# Patient Record
Sex: Male | Born: 1945
Health system: Southern US, Community
[De-identification: ages and names within clinical notes are randomized; demographics above are authoritative.]

## PROBLEM LIST (undated history)

## (undated) DIAGNOSIS — R7303 Prediabetes: Secondary | ICD-10-CM

## (undated) DIAGNOSIS — R112 Nausea with vomiting, unspecified: Secondary | ICD-10-CM

## (undated) DIAGNOSIS — K219 Gastro-esophageal reflux disease without esophagitis: Secondary | ICD-10-CM

## (undated) DIAGNOSIS — I10 Essential (primary) hypertension: Secondary | ICD-10-CM

## (undated) DIAGNOSIS — R0600 Dyspnea, unspecified: Secondary | ICD-10-CM

## (undated) DIAGNOSIS — Z87442 Personal history of urinary calculi: Secondary | ICD-10-CM

## (undated) DIAGNOSIS — Z8719 Personal history of other diseases of the digestive system: Secondary | ICD-10-CM

## (undated) DIAGNOSIS — Z9889 Other specified postprocedural states: Secondary | ICD-10-CM

## (undated) DIAGNOSIS — E119 Type 2 diabetes mellitus without complications: Secondary | ICD-10-CM

## (undated) DIAGNOSIS — B182 Chronic viral hepatitis C: Secondary | ICD-10-CM

## (undated) DIAGNOSIS — M1990 Unspecified osteoarthritis, unspecified site: Secondary | ICD-10-CM

## (undated) HISTORY — DX: Gastro-esophageal reflux disease without esophagitis: K21.9

## (undated) HISTORY — DX: Essential (primary) hypertension: I10

## (undated) HISTORY — DX: Chronic viral hepatitis C: B18.2

## (undated) HISTORY — DX: Prediabetes: R73.03

---

## 1978-03-26 HISTORY — PX: BACK SURGERY: SHX140

## 2003-04-06 ENCOUNTER — Ambulatory Visit (HOSPITAL_COMMUNITY): Admission: RE | Admit: 2003-04-06 | Discharge: 2003-04-06 | Payer: Self-pay | Admitting: Gastroenterology

## 2003-09-24 ENCOUNTER — Encounter: Admission: RE | Admit: 2003-09-24 | Discharge: 2003-09-24 | Payer: Self-pay | Admitting: Family Medicine

## 2005-01-19 ENCOUNTER — Inpatient Hospital Stay: Payer: Self-pay | Admitting: Internal Medicine

## 2005-01-19 ENCOUNTER — Other Ambulatory Visit: Payer: Self-pay

## 2012-02-11 ENCOUNTER — Other Ambulatory Visit: Payer: Self-pay | Admitting: Family Medicine

## 2012-02-11 DIAGNOSIS — R1900 Intra-abdominal and pelvic swelling, mass and lump, unspecified site: Secondary | ICD-10-CM

## 2012-02-13 ENCOUNTER — Ambulatory Visit
Admission: RE | Admit: 2012-02-13 | Discharge: 2012-02-13 | Disposition: A | Discharge status: Home / Self Care | Payer: Medicare Other | Source: Ambulatory Visit | Attending: Family Medicine | Admitting: Family Medicine

## 2012-02-13 DIAGNOSIS — R1900 Intra-abdominal and pelvic swelling, mass and lump, unspecified site: Secondary | ICD-10-CM

## 2012-08-19 ENCOUNTER — Ambulatory Visit (INDEPENDENT_AMBULATORY_CARE_PROVIDER_SITE_OTHER): Payer: Medicare Other | Admitting: Physician Assistant

## 2012-08-19 ENCOUNTER — Encounter: Payer: Self-pay | Admitting: Physician Assistant

## 2012-08-19 VITALS — BP 122/66 | HR 100 | Temp 99.1°F | Resp 18 | Ht 67.5 in | Wt 198.0 lb

## 2012-08-19 DIAGNOSIS — R5381 Other malaise: Secondary | ICD-10-CM

## 2012-08-19 DIAGNOSIS — W57XXXA Bitten or stung by nonvenomous insect and other nonvenomous arthropods, initial encounter: Secondary | ICD-10-CM

## 2012-08-19 DIAGNOSIS — T148 Other injury of unspecified body region: Secondary | ICD-10-CM

## 2012-08-19 DIAGNOSIS — R5383 Other fatigue: Secondary | ICD-10-CM

## 2012-08-19 DIAGNOSIS — IMO0001 Reserved for inherently not codable concepts without codable children: Secondary | ICD-10-CM

## 2012-08-19 MED ORDER — DOXYCYCLINE HYCLATE 100 MG PO TABS: 100.0000 mg | ORAL_TABLET | Freq: Two times a day (BID) | ORAL | Status: AC

## 2012-08-19 NOTE — Progress Notes (Signed)
Patient ID: GURTAJ RUZ MRN: [id number], DOB: 1946/01/14, 67 y.o. Date of Encounter: 08/19/2012, 9:21 AM    Chief Complaint:  Chief Complaint  Patient presents with  . aching all over  joints, head x 5 days  no energy    had removed ticks off him a while ago ??  has had fever  . seen at walk in clinic in [place] over weekend  started o     HPI: 67 y.o. year old male states that symptoms began 5 days ago. At that time, he felt achy all over and had headache. Has pressure in head and eyes. Not blowing anything out of nose. Has no cough. Has had no rash. Has had 3-4 tics that he has pulled off.  Went to [facility] over the weekend. They discussed possibility of Lyme/RMSF but at that time he had had no fever and no rash. So, they really felt it was a URI. Gave Rx for Azithromycin but told him to only fill it if symptoms persisted > 7 days b/c they really thought it was a viral URI.  However, the day after his visit at the [facility] (yesterday) he developed fever of 103.4.  He filled the Azithromycin and started it yesterday.   Still very achy all over , headache, and extreme fatigue. "Had to sit and rest this morning between shower and shaving!" Still no rash.  Home Meds: See attached medication section for any medications that were entered at today's visit. The computer does not put those onto this list.The following list is a list of meds entered prior to today's visit.   No current outpatient prescriptions on file prior to visit.   No current facility-administered medications on file prior to visit.    Allergies:  Allergies  Allergen Reactions  . Omnipaque (Iohexol) Nausea And Vomiting    Pt states he has severe vomiting with IV contrast, especially the older kind he had over 20 yrs ago.  He had it one other time, it was injected slower and he said he did better.  . Norvasc (Amlodipine Besylate) Swelling      Review of Systems: See HPI for pertinent ROS. All other ROS negative.     Physical Exam: Blood pressure 122/66, pulse 100, temperature 99.1 F (37.3 C), temperature source Oral, resp. rate 18, height 5' 7.5" (1.715 m), weight 198 lb (89.812 kg)., Body mass index is 30.54 kg/(m^2). General: WNWD WM. Appears in no acute distress. HEENT: Normocephalic, atraumatic, eyes without discharge, sclera non-icteric, nares are without discharge. Bilateral auditory canals clear, TM's are without perforation, pearly grey and translucent with reflective cone of light bilaterally. Oral cavity moist, posterior pharynx without exudate, erythema, peritonsillar abscess, or post nasal drip.No/Minimal sinus tenderness with percussion.   Neck: Supple. No thyromegaly. No lymphadenopathy. Lungs: Clear bilaterally to auscultation without wheezes, rales, or rhonchi. Breathing is unlabored. Heart: Regular rhythm. No murmurs, rubs, or gallops. Msk:  Strength and tone normal for age. Extremities/Skin: Warm and dry.  No rashes . Neuro: Alert and oriented X 3. Moves all extremities spontaneously. Gait is normal. CNII-XII grossly in tact. Psych:  Responds to questions appropriately with a normal affect.     ASSESSMENT AND PLAN:  67 y.o. year old male with  1. Tick bites - B. burgdorfi antibodies by WB - Rocky mtn spotted fvr abs pnl(IgG+IgM) - doxycycline (VIBRA-TABS) 100 MG tablet; Take 1 tablet (100 mg total) by mouth 2 (two) times daily.  Dispense: 42 tablet; Refill: 0  2. Myalgia  and myositis - B. burgdorfi antibodies by WB - Rocky mtn spotted fvr abs pnl(IgG+IgM) - doxycycline (VIBRA-TABS) 100 MG tablet; Take 1 tablet (100 mg total) by mouth 2 (two) times daily.  Dispense: 42 tablet; Refill: 0  3. Other malaise and fatigue - B. burgdorfi antibodies by WB - Rocky mtn spotted fvr abs pnl(IgG+IgM) - doxycycline (VIBRA-TABS) 100 MG tablet; Take 1 tablet (100 mg total) by mouth 2 (two) times daily.  Dispense: 42 tablet; Refill: 0  Discussed situation and Differential Diagnosis with  patient.  He has taken ZPack yesterday and today-has no GI upset with this. At this point, would complete this.  Will go ahead and start Doxy to cover Lyme/RMSF. Discussed that titers may be falsely negative. Even if lab results negative, he will complete the Doxycylcline.  However, he does opt to have lab done b/c if it is positive, he would like to know confirmed diagnosis.  F/U if worsens, does not resolve.   Signed, [address] [place], [place], [facility] 08/19/2012 9:21 AM

## 2012-08-20 LAB — ROCKY MTN SPOTTED FVR ABS PNL(IGG+IGM)
RMSF IgG: 0.72 IV
RMSF IgM: 0.15 IV

## 2012-08-20 LAB — B. BURGDORFI ANTIBODIES BY WB
B burgdorferi IgG Abs (IB): NEGATIVE
B burgdorferi IgM Abs (IB): NEGATIVE

## 2013-02-04 ENCOUNTER — Other Ambulatory Visit: Payer: Self-pay | Admitting: Family Medicine

## 2013-07-28 ENCOUNTER — Other Ambulatory Visit: Payer: Medicare Other

## 2013-07-28 ENCOUNTER — Other Ambulatory Visit: Payer: Self-pay | Admitting: Family Medicine

## 2013-07-28 DIAGNOSIS — Z79899 Other long term (current) drug therapy: Secondary | ICD-10-CM

## 2013-07-28 DIAGNOSIS — Z0000 Encounter for general adult medical examination without abnormal findings: Secondary | ICD-10-CM

## 2013-07-28 DIAGNOSIS — I10 Essential (primary) hypertension: Secondary | ICD-10-CM

## 2013-07-28 LAB — CBC WITH DIFFERENTIAL/PLATELET
Basophils Absolute: 0.1 10*3/uL (ref 0.0–0.1)
Basophils Relative: 1 % (ref 0–1)
Eosinophils Absolute: 0.3 10*3/uL (ref 0.0–0.7)
Eosinophils Relative: 4 % (ref 0–5)
HCT: 48.7 % (ref 39.0–52.0)
Hemoglobin: 16.8 g/dL (ref 13.0–17.0)
Lymphocytes Relative: 28 % (ref 12–46)
Lymphs Abs: 2 10*3/uL (ref 0.7–4.0)
MCH: 29.7 pg (ref 26.0–34.0)
MCHC: 34.5 g/dL (ref 30.0–36.0)
MCV: 86.2 fL (ref 78.0–100.0)
Monocytes Absolute: 0.6 10*3/uL (ref 0.1–1.0)
Monocytes Relative: 9 % (ref 3–12)
Neutro Abs: 4.1 10*3/uL (ref 1.7–7.7)
Neutrophils Relative %: 58 % (ref 43–77)
Platelets: 227 10*3/uL (ref 150–400)
RBC: 5.65 MIL/uL (ref 4.22–5.81)
RDW: 13.8 % (ref 11.5–15.5)
WBC: 7.1 10*3/uL (ref 4.0–10.5)

## 2013-07-28 LAB — LIPID PANEL
Cholesterol: 146 mg/dL (ref 0–200)
HDL: 61 mg/dL (ref >39)
LDL Cholesterol: 76 mg/dL (ref 0–99)
Total CHOL/HDL Ratio: 2.4 Ratio
Triglycerides: 43 mg/dL (ref <150)
VLDL: 9 mg/dL (ref 0–40)

## 2013-07-28 LAB — COMPLETE METABOLIC PANEL WITH GFR
ALT: 68 U/L — ABNORMAL HIGH (ref 0–53)
AST: 38 U/L — ABNORMAL HIGH (ref 0–37)
Albumin: 4.5 g/dL (ref 3.5–5.2)
Alkaline Phosphatase: 65 U/L (ref 39–117)
BUN: 24 mg/dL — ABNORMAL HIGH (ref 6–23)
CO2: 26 mEq/L (ref 19–32)
Calcium: 10.2 mg/dL (ref 8.4–10.5)
Chloride: 100 mEq/L (ref 96–112)
Creat: 1.14 mg/dL (ref 0.50–1.35)
GFR, Est African American: 76 mL/min
GFR, Est Non African American: 66 mL/min
Glucose, Bld: 119 mg/dL — ABNORMAL HIGH (ref 70–99)
Potassium: 5.5 mEq/L — ABNORMAL HIGH (ref 3.5–5.3)
Sodium: 137 mEq/L (ref 135–145)
Total Bilirubin: 1 mg/dL (ref 0.2–1.2)
Total Protein: 7.5 g/dL (ref 6.0–8.3)

## 2013-07-29 LAB — PSA, MEDICARE: PSA: 0.28 ng/mL (ref <=4.00)

## 2013-07-31 ENCOUNTER — Ambulatory Visit (INDEPENDENT_AMBULATORY_CARE_PROVIDER_SITE_OTHER): Payer: Medicare Other | Admitting: Family Medicine

## 2013-07-31 ENCOUNTER — Encounter: Payer: Self-pay | Admitting: Family Medicine

## 2013-07-31 VITALS — BP 126/70 | HR 72 | Temp 97.9°F | Resp 18 | Wt 201.0 lb

## 2013-07-31 DIAGNOSIS — R7303 Prediabetes: Secondary | ICD-10-CM | POA: Insufficient documentation

## 2013-07-31 DIAGNOSIS — I10 Essential (primary) hypertension: Secondary | ICD-10-CM

## 2013-07-31 DIAGNOSIS — Z79899 Other long term (current) drug therapy: Secondary | ICD-10-CM

## 2013-07-31 LAB — HEPATITIS PANEL, ACUTE
HCV Ab: REACTIVE — AB
Hep A IgM: NONREACTIVE
Hep B C IgM: NONREACTIVE
Hepatitis B Surface Ag: NEGATIVE

## 2013-07-31 LAB — HEMOGLOBIN A1C
Hgb A1c MFr Bld: 6.3 % — ABNORMAL HIGH (ref <5.7)
Mean Plasma Glucose: 134 mg/dL — ABNORMAL HIGH (ref <117)

## 2013-07-31 NOTE — Progress Notes (Signed)
Subjective:    Patient ID: James Harper, male    DOB: 15-Feb-1946, 68 y.o.   MRN: [id number]  HPI Patient is here today to followup for his hypertension. He is currently on Hyzaar 50/12.5 one by mouth daily. His blood pressures well controlled at 126/70. His most recent lab work is listed below: Lab on 07/28/2013  Component Date Value Ref Range Status  . Cholesterol 07/28/2013 146  0 - 200 mg/dL Final   Comment: ATP III Classification:                                < 200        mg/dL        Desirable                               200 - 239     mg/dL        Borderline High                               >= 240        mg/dL        High                             . Triglycerides 07/28/2013 43  <150 mg/dL Final  . HDL 07/28/2013 61  >39 mg/dL Final  . Total CHOL/HDL Ratio 07/28/2013 2.4   Final  . VLDL 07/28/2013 9  0 - 40 mg/dL Final  . LDL Cholesterol 07/28/2013 76  0 - 99 mg/dL Final   Comment:                            Total Cholesterol/HDL Ratio:CHD Risk                                                 Coronary Heart Disease Risk Table                                                                 Men       Women                                   1/2 Average Risk              3.4        3.3                                       Average Risk              5.0        4.4  2X Average Risk              9.6        7.1                                    3X Average Risk             23.4       11.0                          Use the calculated Patient Ratio above and the CHD Risk table                           to determine the patient's CHD Risk.                          ATP III Classification (LDL):                                < 100        mg/dL         Optimal                               100 - 129     mg/dL         Near or Above Optimal                               130 - 159     mg/dL         Borderline High                               160 - 189      mg/dL         High                                > 190        mg/dL         Very High                             . WBC 07/28/2013 7.1  4.0 - 10.5 K/uL Final  . RBC 07/28/2013 5.65  4.22 - 5.81 MIL/uL Final  . Hemoglobin 07/28/2013 16.8  13.0 - 17.0 g/dL Final  . HCT 07/28/2013 48.7  39.0 - 52.0 % Final  . MCV 07/28/2013 86.2  78.0 - 100.0 fL Final  . MCH 07/28/2013 29.7  26.0 - 34.0 pg Final  . MCHC 07/28/2013 34.5  30.0 - 36.0 g/dL Final  . RDW 07/28/2013 13.8  11.5 - 15.5 % Final  . Platelets 07/28/2013 227  150 - 400 K/uL Final  . Neutrophils Relative % 07/28/2013 58  43 - 77 % Final  . Neutro Abs 07/28/2013 4.1  1.7 - 7.7 K/uL Final  . Lymphocytes Relative 07/28/2013 28  12 - 46 % Final  . Lymphs Abs 07/28/2013 2.0  0.7 - 4.0  K/uL Final  . Monocytes Relative 07/28/2013 9  3 - 12 % Final  . Monocytes Absolute 07/28/2013 0.6  0.1 - 1.0 K/uL Final  . Eosinophils Relative 07/28/2013 4  0 - 5 % Final  . Eosinophils Absolute 07/28/2013 0.3  0.0 - 0.7 K/uL Final  . Basophils Relative 07/28/2013 1  0 - 1 % Final  . Basophils Absolute 07/28/2013 0.1  0.0 - 0.1 K/uL Final  . Smear Review 07/28/2013 Criteria for review not met   Final  . Sodium 07/28/2013 137  135 - 145 mEq/L Final  . Potassium 07/28/2013 5.5* 3.5 - 5.3 mEq/L Final   No visible hemolysis.  . Chloride 07/28/2013 100  96 - 112 mEq/L Final  . CO2 07/28/2013 26  19 - 32 mEq/L Final  . Glucose, Bld 07/28/2013 119* 70 - 99 mg/dL Final  . BUN 07/28/2013 24* 6 - 23 mg/dL Final  . Creat 07/28/2013 1.14  0.50 - 1.35 mg/dL Final  . Total Bilirubin 07/28/2013 1.0  0.2 - 1.2 mg/dL Final  . Alkaline Phosphatase 07/28/2013 65  39 - 117 U/L Final  . AST 07/28/2013 38* 0 - 37 U/L Final  . ALT 07/28/2013 68* 0 - 53 U/L Final  . Total Protein 07/28/2013 7.5  6.0 - 8.3 g/dL Final  . Albumin 07/28/2013 4.5  3.5 - 5.2 g/dL Final  . Calcium 07/28/2013 10.2  8.4 - 10.5 mg/dL Final  . GFR, Est African American 07/28/2013 76   Final  .  GFR, Est Non African American 07/28/2013 66   Final   Comment:                            The estimated GFR is a calculation valid for adults (>=43 years old)                          that uses the CKD-EPI algorithm to adjust for age and sex. It is                            not to be used for children, pregnant women, hospitalized patients,                             patients on dialysis, or with rapidly changing kidney function.                          According to the NKDEP, eGFR >89 is normal, 60-89 shows mild                          impairment, 30-59 shows moderate impairment, 15-29 shows severe                          impairment and <15 is ESRD.                             Marland Kitchen PSA 07/28/2013 0.28  <=4.00 ng/mL Final   Comment: Test Methodology: ECLIA PSA (Electrochemiluminescence Immunoassay)  For PSA values from 2.5-4.0, particularly in younger men <60 years                          old, the AUA and NCCN suggest testing for % Free PSA (3515) and                          evaluation of the rate of increase in PSA (PSA velocity).   Patient refuses colonoscopy. He refuses Fosamax. He refuses pneumonia vaccine. His most recent lab work is significant for mild liver irritation along with an elevated blood sugar of 119. The remainder of his review of systems is negative. Past Medical History  Diagnosis Date  . Hypertension   . GERD (gastroesophageal reflux disease)   . Prediabetes    No past surgical history on file. Current Outpatient Prescriptions on File Prior to Visit  Medication Sig Dispense Refill  . losartan-hydrochlorothiazide (HYZAAR) 50-12.5 MG per tablet TAKE 1 TABLET EVERY DAY  30 tablet  11  . Pantoprazole Sodium (PROTONIX PO) Take 40 mg by mouth daily as needed.        No current facility-administered medications on file prior to visit.   Allergies  Allergen Reactions  . Omnipaque [Iohexol] Nausea And Vomiting    Pt  states he has severe vomiting with IV contrast, especially the older kind he had over 20 yrs ago.  He had it one other time, it was injected slower and he said he did better.  Marland Kitchen Norvasc [Amlodipine Besylate] Swelling   History   Social History  . Marital Status: Married    Spouse Name: N/A    Number of Children: N/A  . Years of Education: N/A   Occupational History  . Not on file.   Social History Main Topics  . Smoking status: Former Smoker    Quit date: 08/19/1972  . Smokeless tobacco: Never Used  . Alcohol Use: No  . Drug Use: No  . Sexual Activity: Not on file   Other Topics Concern  . Not on file   Social History Narrative  . No narrative on file      Review of Systems  All other systems reviewed and are negative.      Objective:   Physical Exam  Vitals reviewed. Neck: Neck supple. No JVD present. No thyromegaly present.  Cardiovascular: Normal rate, regular rhythm and normal heart sounds.   No murmur heard. Pulmonary/Chest: Effort normal and breath sounds normal. No respiratory distress. He has no wheezes. He has no rales. He exhibits no tenderness.  Abdominal: Soft. Bowel sounds are normal. He exhibits no distension and no mass. There is no tenderness. There is no rebound and no guarding.  Musculoskeletal: He exhibits no edema.  Lymphadenopathy:    He has no cervical adenopathy.          Assessment & Plan:  1. HTN (hypertension)  2. Encounter for long-term (current) use of other medications Blood pressure is well controlled. I will add a hemoglobin A1c to evaluate for prediabetes. We discussed therapeutic lifestyle changes including diet and exercise. If the patient's hemoglobin A1c is greater than 6.5 I recommend starting metformin. His cholesterol is excellent. His PSA is within normal limits. I recommended a colonoscopy, pneumonia vaccine, and shingles vaccine all of  which the patient refused.  I also check a viral hepatitis panel given his mildly  elevated liver function tests. I recommended abstinence from  alcohol.

## 2013-08-04 ENCOUNTER — Other Ambulatory Visit: Payer: Self-pay | Admitting: Family Medicine

## 2013-08-04 ENCOUNTER — Other Ambulatory Visit: Payer: Medicare Other

## 2013-08-04 DIAGNOSIS — R768 Other specified abnormal immunological findings in serum: Secondary | ICD-10-CM

## 2013-08-04 DIAGNOSIS — K219 Gastro-esophageal reflux disease without esophagitis: Secondary | ICD-10-CM

## 2013-08-04 MED ORDER — PANTOPRAZOLE SODIUM 40 MG PO TBEC: 40.0000 mg | DELAYED_RELEASE_TABLET | Freq: Every day | ORAL | Status: DC

## 2013-08-04 NOTE — Telephone Encounter (Signed)
Message copied by Olena Mater on Tue Aug 04, 2013  8:49 AM ------      Message from: Jenna Luo      Created: Mon Aug 03, 2013  7:25 AM       Prediabetes is stable.  Unfortunately, labs suggest he may have hepatitis C.  Please come in for Hep C RNA Quant (lab [id number]) to confirm.  Then we will discuss the next step. ------

## 2013-08-04 NOTE — Telephone Encounter (Signed)
Pt aware of lab results and need for further blood work.  Told can come to office any time.  Lab ordered.  Asked for refill of protonix, Rx sent.

## 2013-08-05 LAB — HEPATITIS C RNA QUANTITATIVE
HCV Quantitative Log: 6.61 {Log} — ABNORMAL HIGH (ref <1.18)
HCV Quantitative: 4120183 IU/mL — ABNORMAL HIGH (ref <15)

## 2013-08-11 ENCOUNTER — Encounter: Payer: Self-pay | Admitting: Family Medicine

## 2013-08-11 ENCOUNTER — Ambulatory Visit (INDEPENDENT_AMBULATORY_CARE_PROVIDER_SITE_OTHER): Payer: Medicare Other | Admitting: Family Medicine

## 2013-08-11 VITALS — BP 140/80 | HR 80 | Temp 98.5°F | Resp 16 | Ht 69.0 in | Wt 200.0 lb

## 2013-08-11 DIAGNOSIS — B182 Chronic viral hepatitis C: Secondary | ICD-10-CM

## 2013-08-11 LAB — HEPATITIS C GENOTYPE

## 2013-08-11 NOTE — Progress Notes (Signed)
 Subjective:    Patient ID: James Harper, male    DOB: 03/16/1946, 68 y.o.   MRN: [id number]  HPI  When the patient was here for his last medicine check, I discovered that he had mild elevations in his liver function test. I therefore performed a viral hepatitis panel which returned positive for antibodies to hepatitis C. I confirmed the presence of active bowel hepatitis C by checking a viral load. Patient's genotype returned positive for type IA. Past Medical History  Diagnosis Date  . Hypertension   . GERD (gastroesophageal reflux disease)   . Prediabetes   . Hep C w/o coma, chronic    No past surgical history on file. Current Outpatient Prescriptions on File Prior to Visit  Medication Sig Dispense Refill  . losartan-hydrochlorothiazide (HYZAAR) 50-12.5 MG per tablet TAKE 1 TABLET EVERY DAY  30 tablet  11  . pantoprazole (PROTONIX) 40 MG tablet Take 1 tablet (40 mg total) by mouth daily.  30 tablet  3   No current facility-administered medications on file prior to visit.   Allergies  Allergen Reactions  . Omnipaque [Iohexol] Nausea And Vomiting    Pt states he has severe vomiting with IV contrast, especially the older kind he had over 20 yrs ago.  He had it one other time, it was injected slower and he said he did better.  . Norvasc [Amlodipine Besylate] Swelling   History   Social History  . Marital Status: Married    Spouse Name: N/A    Number of Children: N/A  . Years of Education: N/A   Occupational History  . Not on file.   Social History Main Topics  . Smoking status: Former Smoker    Quit date: 08/19/1972  . Smokeless tobacco: Never Used  . Alcohol Use: No  . Drug Use: No  . Sexual Activity: Not on file   Other Topics Concern  . Not on file   Social History Narrative  . No narrative on file     Review of Systems  All other systems reviewed and are negative.      Objective:   Physical Exam  Vitals reviewed. Cardiovascular: Normal rate, regular  rhythm and normal heart sounds.   Pulmonary/Chest: Effort normal and breath sounds normal.  Abdominal: Soft. Bowel sounds are normal.          Assessment & Plan:  1. Hep C w/o coma, chronic I referred the patient to hepatitis C clinic.  His most recent lab work is listed below: Appointment on 08/04/2013  Component Date Value Ref Range Status  . HCV Quantitative 08/04/2013 [id number]* <15 IU/mL Final  . HCV Quantitative Log 08/04/2013 6.61* <1.18 log 10 Final   Comment:                            This test utilizes the [place] FDA approved Roche HCV Test Kit by RT-PCR.  Orders Only on 08/04/2013  Component Date Value Ref Range Status  . HCV Genotype 08/04/2013 1a   Final   Test Methodology: Abbott RealTime HCV Genotype II Assay.  Lab on 07/28/2013  Component Date Value Ref Range Status  . Cholesterol 07/28/2013 146  0 - 200 mg/dL Final   Comment: ATP III Classification:                                < 200          mg/dL        Desirable                               200 - 239     mg/dL        Borderline High                               >= 240        mg/dL        High                             . Triglycerides 07/28/2013 43  <150 mg/dL Final  . HDL 07/28/2013 61  >39 mg/dL Final  . Total CHOL/HDL Ratio 07/28/2013 2.4   Final  . VLDL 07/28/2013 9  0 - 40 mg/dL Final  . LDL Cholesterol 07/28/2013 76  0 - 99 mg/dL Final   Comment:                            Total Cholesterol/HDL Ratio:CHD Risk                                                 Coronary Heart Disease Risk Table                                                                 Men       Women                                   1/2 Average Risk              3.4        3.3                                       Average Risk              5.0        4.4                                    2X Average Risk              9.6        7.1                                    3X Average Risk             23.4       11.0  Use the calculated Patient Ratio above and the CHD Risk table                           to determine the patient's CHD Risk.                          ATP III Classification (LDL):                                < 100        mg/dL         Optimal                               100 - 129     mg/dL         Near or Above Optimal                               130 - 159     mg/dL         Borderline High                               160 - 189     mg/dL         High                                > 190        mg/dL         Very High                             . WBC 07/28/2013 7.1  4.0 - 10.5 K/uL Final  . RBC 07/28/2013 5.65  4.22 - 5.81 MIL/uL Final  . Hemoglobin 07/28/2013 16.8  13.0 - 17.0 g/dL Final  . HCT 07/28/2013 48.7  39.0 - 52.0 % Final  . MCV 07/28/2013 86.2  78.0 - 100.0 fL Final  . MCH 07/28/2013 29.7  26.0 - 34.0 pg Final  . MCHC 07/28/2013 34.5  30.0 - 36.0 g/dL Final  . RDW 07/28/2013 13.8  11.5 - 15.5 % Final  . Platelets 07/28/2013 227  150 - 400 K/uL Final  . Neutrophils Relative % 07/28/2013 58  43 - 77 % Final  . Neutro Abs 07/28/2013 4.1  1.7 - 7.7 K/uL Final  . Lymphocytes Relative 07/28/2013 28  12 - 46 % Final  . Lymphs Abs 07/28/2013 2.0  0.7 - 4.0 K/uL Final  . Monocytes Relative 07/28/2013 9  3 - 12 % Final  . Monocytes Absolute 07/28/2013 0.6  0.1 - 1.0 K/uL Final  . Eosinophils Relative 07/28/2013 4  0 - 5 % Final  . Eosinophils Absolute 07/28/2013 0.3  0.0 - 0.7 K/uL Final  . Basophils Relative 07/28/2013 1  0 - 1 % Final  . Basophils Absolute 07/28/2013 0.1  0.0 - 0.1 K/uL Final  . Smear Review 07/28/2013 Criteria for review not met   Final  . Sodium 07/28/2013 137  135 - 145 mEq/L Final  . Potassium 07/28/2013 5.5* 3.5 - 5.3 mEq/L Final     No visible hemolysis.  . Chloride 07/28/2013 100  96 - 112 mEq/L Final  . CO2 07/28/2013 26  19 - 32 mEq/L Final  . Glucose, Bld 07/28/2013 119* 70 - 99 mg/dL Final  . BUN 07/28/2013 24* 6 - 23 mg/dL Final  . Creat  07/28/2013 1.14  0.50 - 1.35 mg/dL Final  . Total Bilirubin 07/28/2013 1.0  0.2 - 1.2 mg/dL Final  . Alkaline Phosphatase 07/28/2013 65  39 - 117 U/L Final  . AST 07/28/2013 38* 0 - 37 U/L Final  . ALT 07/28/2013 68* 0 - 53 U/L Final  . Total Protein 07/28/2013 7.5  6.0 - 8.3 g/dL Final  . Albumin 07/28/2013 4.5  3.5 - 5.2 g/dL Final  . Calcium 07/28/2013 10.2  8.4 - 10.5 mg/dL Final  . GFR, Est African American 07/28/2013 76   Final  . GFR, Est Non African American 07/28/2013 66   Final   Comment:                            The estimated GFR is a calculation valid for adults (>=18 years old)                          that uses the CKD-EPI algorithm to adjust for age and sex. It is                            not to be used for children, pregnant women, hospitalized patients,                             patients on dialysis, or with rapidly changing kidney function.                          According to the NKDEP, eGFR >89 is normal, 60-89 shows mild                          impairment, 30-59 shows moderate impairment, 15-29 shows severe                          impairment and <15 is ESRD.                             . PSA 07/28/2013 0.28  <=4.00 ng/mL Final   Comment: Test Methodology: ECLIA PSA (Electrochemiluminescence Immunoassay)                                                     For PSA values from 2.5-4.0, particularly in younger men <60 years                          old, the AUA and NCCN suggest testing for % Free PSA (3515) and                          evaluation of the rate of increase in PSA (PSA velocity).  Orders Only on 07/28/2013  Component   Date Value Ref Range Status  . Hepatitis B Surface Ag 07/28/2013 NEGATIVE  NEGATIVE Final  . HCV Ab 07/28/2013 REACTIVE* NEGATIVE Corrected   Comment:                                                                                                 This test is for screening purposes only.  Reactive results should be                           confirmed by an alternative method.  Suggest HCV Qualitative, PCR,                          test code 83130.  Specimens will be stable for reflex testing up to 3                          days after collection.                          Result repeated and verified.                                                     Amended report.  . Hep B C IgM 07/28/2013 NON REACTIVE  NON REACTIVE Final   Comment: High levels of Hepatitis B Core IgM antibody are detectable                          during the acute stage of Hepatitis B. This antibody is used                          to differentiate current from past HBV infection.                             . Hep A IgM 07/28/2013 NON REACTIVE  NON REACTIVE Final  . Hemoglobin A1C 07/28/2013 6.3* <5.7 % Final   Comment:                                                                                                 According to the ADA Clinical Practice Recommendations for 2011, when                          HbA1c is used  as a screening test:                                                       >=6.5%   Diagnostic of Diabetes Mellitus                                     (if abnormal result is confirmed)                                                     5.7-6.4%   Increased risk of developing Diabetes Mellitus                                                     References:Diagnosis and Classification of Diabetes Mellitus,Diabetes                          ZOXW,9604,54(UJWJX 1):S62-S69 and Standards of Medical Care in                                  Diabetes - 2011,Diabetes BJYN,8295,62 (Suppl 1):S11-S61.                             . Mean Plasma Glucose 07/28/2013 134* <117 mg/dL Final   I recommended avoidance of alcohol. Recheck fasting lipid panel and Hg A1c in 3-6 months. - Ambulatory referral to Gastroenterology

## 2013-08-19 ENCOUNTER — Telehealth: Payer: Self-pay | Admitting: *Deleted

## 2013-08-19 NOTE — Telephone Encounter (Signed)
wife called checkin on status of referral I told her I had lm on vm for [facility] to contact me and have not heard anything yet. I call Dawn from [facility] and she stated that they have recieved referal but no appt has been mad yet d/t trying to contact pt and no return call. they will try pt again today to schedule appt. I also contacted [facility] and lm with them also to see if can schedule pt.

## 2013-09-17 ENCOUNTER — Other Ambulatory Visit: Payer: Self-pay | Admitting: Nurse Practitioner

## 2013-09-17 DIAGNOSIS — C220 Liver cell carcinoma: Secondary | ICD-10-CM

## 2013-09-23 ENCOUNTER — Ambulatory Visit
Admission: RE | Admit: 2013-09-23 | Discharge: 2013-09-23 | Disposition: A | Discharge status: Home / Self Care | Payer: Medicare Other | Source: Ambulatory Visit | Attending: Nurse Practitioner | Admitting: Nurse Practitioner

## 2013-09-23 DIAGNOSIS — C220 Liver cell carcinoma: Secondary | ICD-10-CM

## 2014-01-08 ENCOUNTER — Other Ambulatory Visit: Payer: Self-pay

## 2014-02-13 ENCOUNTER — Other Ambulatory Visit: Payer: Self-pay | Admitting: Family Medicine

## 2014-02-27 ENCOUNTER — Other Ambulatory Visit: Payer: Self-pay | Admitting: Family Medicine

## 2014-09-20 ENCOUNTER — Other Ambulatory Visit: Payer: Self-pay

## 2015-02-01 ENCOUNTER — Ambulatory Visit (INDEPENDENT_AMBULATORY_CARE_PROVIDER_SITE_OTHER): Payer: Medicare Other | Admitting: Family Medicine

## 2015-02-01 ENCOUNTER — Encounter: Payer: Self-pay | Admitting: Family Medicine

## 2015-02-01 VITALS — BP 150/90 | HR 68 | Temp 98.5°F | Resp 18 | Wt 208.0 lb

## 2015-02-01 DIAGNOSIS — I10 Essential (primary) hypertension: Secondary | ICD-10-CM | POA: Diagnosis not present

## 2015-02-01 DIAGNOSIS — R739 Hyperglycemia, unspecified: Secondary | ICD-10-CM

## 2015-02-01 DIAGNOSIS — J019 Acute sinusitis, unspecified: Secondary | ICD-10-CM

## 2015-02-01 MED ORDER — LOSARTAN POTASSIUM-HCTZ 50-12.5 MG PO TABS: 2.0000 | ORAL_TABLET | Freq: Every day | ORAL | Status: DC

## 2015-02-01 MED ORDER — AMOXICILLIN-POT CLAVULANATE 875-125 MG PO TABS: 1.0000 | ORAL_TABLET | Freq: Two times a day (BID) | ORAL | Status: DC

## 2015-02-01 NOTE — Progress Notes (Signed)
   Subjective:    Patient ID: James Harper, male    DOB: 1945/11/08, 69 y.o.   MRN: [id number]  HPI Patient has 3-4 weeks of pain in both maxillary sinuses, postnasal drip. Both ears feel completely stopped up. He has ringing in his right ear. He reports dull headaches. He has had subjective fevers. He has tried nasal saline and tincture of time however his sinuses continue to worsen. He has difficult time breathing through his nose. His blood pressures elevated. He also reports that his blood sugars at home have been ranging in the 140s. Past Medical History  Diagnosis Date  . Hypertension   . GERD (gastroesophageal reflux disease)   . Prediabetes   . Hep C w/o coma, chronic (HCC)    No past surgical history on file. Current Outpatient Prescriptions on File Prior to Visit  Medication Sig Dispense Refill  . pantoprazole (PROTONIX) 40 MG tablet TAKE 1 TABLET BY MOUTH EVERY DAY 30 tablet 11   No current facility-administered medications on file prior to visit.   Allergies  Allergen Reactions  . Omnipaque [Iohexol] Nausea And Vomiting    Pt states he has severe vomiting with IV contrast, especially the older kind he had over 20 yrs ago.  He had it one other time, it was injected slower and he said he did better.  . Norvasc [Amlodipine Besylate] Swelling   Social History   Social History  . Marital Status: Married    Spouse Name: N/A  . Number of Children: N/A  . Years of Education: N/A   Occupational History  . Not on file.   Social History Main Topics  . Smoking status: Former Smoker    Quit date: 08/19/1972  . Smokeless tobacco: Never Used  . Alcohol Use: No  . Drug Use: No  . Sexual Activity: Not on file   Other Topics Concern  . Not on file   Social History Narrative      Review of Systems  All other systems reviewed and are negative.      Objective:   Physical Exam  Constitutional: He appears well-developed and well-nourished.  HENT:  Right Ear:  Tympanic membrane, external ear and ear canal normal.  Left Ear: Tympanic membrane, external ear and ear canal normal.  Nose: Mucosal edema and rhinorrhea present. Right sinus exhibits maxillary sinus tenderness. Left sinus exhibits maxillary sinus tenderness.  Mouth/Throat: Oropharynx is clear and moist. No oropharyngeal exudate.  Cardiovascular: Normal rate, regular rhythm and normal heart sounds.   Pulmonary/Chest: Effort normal and breath sounds normal.  Vitals reviewed.         Assessment & Plan:  Acute rhinosinusitis - Plan: amoxicillin-clavulanate (AUGMENTIN) 875-125 MG tablet  Essential hypertension - Plan: COMPLETE METABOLIC PANEL WITH GFR, Lipid panel  Hyperglycemia - Plan: COMPLETE METABOLIC PANEL WITH GFR, Hemoglobin A1c, Lipid panel  Patient has a sinus infection. Begin Augmentin 875 mg by mouth twice a day for 10 days. Increase Hyzaar to 100/25 by mouth daily for hypertension. Return fasting for a CMP, fasting lipid panel, and hemoglobin A1c to monitor his blood sugar.

## 2015-02-02 ENCOUNTER — Other Ambulatory Visit: Payer: Medicare Other

## 2015-02-02 LAB — COMPLETE METABOLIC PANEL WITH GFR
ALT: 15 U/L (ref 9–46)
AST: 17 U/L (ref 10–35)
Albumin: 4.4 g/dL (ref 3.6–5.1)
Alkaline Phosphatase: 65 U/L (ref 40–115)
BUN: 17 mg/dL (ref 7–25)
CO2: 29 mmol/L (ref 20–31)
Calcium: 9.7 mg/dL (ref 8.6–10.3)
Chloride: 103 mmol/L (ref 98–110)
Creat: 0.94 mg/dL (ref 0.70–1.25)
GFR, Est African American: >89 mL/min (ref >=60)
GFR, Est Non African American: 82 mL/min (ref >=60)
Glucose, Bld: 112 mg/dL — ABNORMAL HIGH (ref 70–99)
Potassium: 5.2 mmol/L (ref 3.5–5.3)
Sodium: 139 mmol/L (ref 135–146)
Total Bilirubin: 1 mg/dL (ref 0.2–1.2)
Total Protein: 7.4 g/dL (ref 6.1–8.1)

## 2015-02-02 LAB — LIPID PANEL
Cholesterol: 141 mg/dL (ref 125–200)
HDL: 52 mg/dL (ref >=40)
LDL Cholesterol: 79 mg/dL (ref <130)
Total CHOL/HDL Ratio: 2.7 Ratio (ref <=5.0)
Triglycerides: 50 mg/dL (ref <150)
VLDL: 10 mg/dL (ref <30)

## 2015-02-02 LAB — HEMOGLOBIN A1C
Hgb A1c MFr Bld: 6.3 % — ABNORMAL HIGH (ref <5.7)
Mean Plasma Glucose: 134 mg/dL — ABNORMAL HIGH (ref <117)

## 2015-02-03 ENCOUNTER — Encounter: Payer: Self-pay | Admitting: *Deleted

## 2015-02-09 ENCOUNTER — Other Ambulatory Visit: Payer: Self-pay | Admitting: Family Medicine

## 2015-02-09 NOTE — Telephone Encounter (Signed)
Medication refilled per protocol. 

## 2015-12-27 ENCOUNTER — Encounter: Payer: Self-pay | Admitting: Family Medicine

## 2015-12-27 ENCOUNTER — Ambulatory Visit (INDEPENDENT_AMBULATORY_CARE_PROVIDER_SITE_OTHER): Payer: Medicare Other | Admitting: Family Medicine

## 2015-12-27 VITALS — BP 140/78 | HR 80 | Temp 98.0°F | Resp 14 | Ht 69.0 in | Wt 210.0 lb

## 2015-12-27 DIAGNOSIS — R739 Hyperglycemia, unspecified: Secondary | ICD-10-CM

## 2015-12-27 DIAGNOSIS — J019 Acute sinusitis, unspecified: Secondary | ICD-10-CM

## 2015-12-27 DIAGNOSIS — I10 Essential (primary) hypertension: Secondary | ICD-10-CM | POA: Diagnosis not present

## 2015-12-27 DIAGNOSIS — Z125 Encounter for screening for malignant neoplasm of prostate: Secondary | ICD-10-CM | POA: Diagnosis not present

## 2015-12-27 LAB — CBC WITH DIFFERENTIAL/PLATELET
Basophils Absolute: 0 cells/uL (ref 0–200)
Basophils Relative: 0 %
Eosinophils Absolute: 284 cells/uL (ref 15–500)
Eosinophils Relative: 4 %
HCT: 45.2 % (ref 38.5–50.0)
Hemoglobin: 15.3 g/dL (ref 13.0–17.0)
Lymphocytes Relative: 28 %
Lymphs Abs: 1988 cells/uL (ref 850–3900)
MCH: 29.6 pg (ref 27.0–33.0)
MCHC: 33.8 g/dL (ref 32.0–36.0)
MCV: 87.4 fL (ref 80.0–100.0)
MPV: 10.9 fL (ref 7.5–12.5)
Monocytes Absolute: 497 cells/uL (ref 200–950)
Monocytes Relative: 7 %
Neutro Abs: 4331 cells/uL (ref 1500–7800)
Neutrophils Relative %: 61 %
Platelets: 229 10*3/uL (ref 140–400)
RBC: 5.17 MIL/uL (ref 4.20–5.80)
RDW: 13.5 % (ref 11.0–15.0)
WBC: 7.1 10*3/uL (ref 3.8–10.8)

## 2015-12-27 LAB — PSA: PSA: 0.2 ng/mL (ref <=4.0)

## 2015-12-27 MED ORDER — CEFDINIR 300 MG PO CAPS: 300.0000 mg | ORAL_CAPSULE | Freq: Two times a day (BID) | ORAL | 0 refills | Status: DC

## 2015-12-27 NOTE — Progress Notes (Signed)
Subjective:    Patient ID: James Harper, male    DOB: [birth year], 70 y.o.   MRN: [id number]  HPI 07/2013 When the patient was here for his last medicine check, I discovered that he had mild elevations in his liver function test. I therefore performed a viral hepatitis panel which returned positive for antibodies to hepatitis C. I confirmed the presence of active bowel hepatitis C by checking a viral load. Patient's genotype returned positive for type IA.  At that time, my plan was: 1. Hep C w/o coma, chronic I referred the patient to hepatitis C clinic. I recommended avoidance of alcohol. Recheck fasting lipid panel and Hg A1c in 3-6 months. - Ambulatory referral to Gastroenterology  12/27/2015 Has not been seen for follow up of his chronic medical conditions since.  The patient has been checking his blood sugar at home.  Fasting sugars can range from 130-150.  2 hour post prandial sugars are <120.  Denies CP/SOB/DOE.  Denies polyyuria, polydypsia, or blurry vision.  Refuses flu shot and pneumovax.  Would like PSA.  For the last 4 or 5 weeks, the patient has had right-sided headache. It is primarily centered in his right frontal sinus and right maxillary sinus. He describes it as a constant pressure. He also reports pressure and pain in his right ear and in his right jaw. Occasionally the pain will radiate into his right occiput. He also reports possibly some mild blurry vision in his right eye. He denies any cough or sore throat or fever or postnasal drip. However he is tender to percussion over his right maxillary sinus Past Medical History:  Diagnosis Date  . GERD (gastroesophageal reflux disease)   . Hep C w/o coma, chronic ([place])   . Hypertension   . Prediabetes    No past surgical history on file. Current Outpatient Prescriptions on File Prior to Visit  Medication Sig Dispense Refill  . amoxicillin-clavulanate (AUGMENTIN) 875-125 MG tablet Take 1 tablet by mouth 2 (two) times daily. 20  tablet 0  . losartan-hydrochlorothiazide (HYZAAR) 50-12.5 MG tablet TAKE 1 TABLET BY MOUTH EVERY DAY 90 tablet 1  . pantoprazole (PROTONIX) 40 MG tablet TAKE 1 TABLET BY MOUTH EVERY DAY 30 tablet 11   No current facility-administered medications on file prior to visit.    Allergies  Allergen Reactions  . Omnipaque [Iohexol] Nausea And Vomiting    Pt states he has severe vomiting with IV contrast, especially the older kind he had over 20 yrs ago.  He had it one other time, it was injected slower and he said he did better.  . Norvasc [Amlodipine Besylate] Swelling   Social History   Social History  . Marital status: Married    Spouse name: N/A  . Number of children: N/A  . Years of education: N/A   Occupational History  . Not on file.   Social History Main Topics  . Smoking status: Former Smoker    Quit date: 08/19/1972  . Smokeless tobacco: Never Used  . Alcohol use No  . Drug use: No  . Sexual activity: Not on file   Other Topics Concern  . Not on file   Social History Narrative  . No narrative on file     Review of Systems  All other systems reviewed and are negative.      Objective:   Physical Exam  Constitutional: He is oriented to person, place, and time. He appears well-developed and well-nourished. No distress.  HENT:  Right Ear: Tympanic membrane, external ear and ear canal normal.  Left Ear: Tympanic membrane, external ear and ear canal normal.  Nose: No mucosal edema or rhinorrhea. Right sinus exhibits maxillary sinus tenderness and frontal sinus tenderness. Left sinus exhibits no maxillary sinus tenderness and no frontal sinus tenderness.  Mouth/Throat: Oropharynx is clear and moist. No oropharyngeal exudate.  Neck: Neck supple. No JVD present.  Cardiovascular: Normal rate, regular rhythm and normal heart sounds.   No murmur heard. Pulmonary/Chest: Effort normal and breath sounds normal. No respiratory distress. He has no wheezes. He has no rales.    Abdominal: Soft. Bowel sounds are normal.  Musculoskeletal: He exhibits no edema.  Lymphadenopathy:    He has no cervical adenopathy.  Neurological: He is alert and oriented to person, place, and time. He has normal reflexes. He displays normal reflexes. No cranial nerve deficit. He exhibits normal muscle tone. Coordination normal.  Skin: He is not diaphoretic.  Vitals reviewed.         Assessment & Plan:  Essential hypertension  Hyperglycemia - Plan: CBC with Differential/Platelet, COMPLETE METABOLIC PANEL WITH GFR, Lipid panel, Microalbumin, urine, Hemoglobin A1c  Prostate cancer screening - Plan: PSA  Acute rhinosinusitis - Plan: cefdinir (OMNICEF) 300 MG capsule  The patient's blood pressure is acceptable today. I will check a fasting lipid panel. Goal LDL cholesterol is less than 100. I will also check a microalbumin and hemoglobin A1c. While checking fasting lab work I will also check a PSA. He declines the flu shot and Pneumovax. His history is concerning for a sinus infection although the history is also somewhat atypical for sinus infection given the lack of fever and rhinorrhea. Symptoms do not sound like a stroke or an aneurysm. However I cannot rule out space-occupying lesion inside the skull. Therefore we are going to try high-dose antibiotics for sinus infection for 10 days using Omnicef 300 mg by mouth twice a day. If symptoms are not improving by next week, we are going to proceed with a CT scan of the head to rule out space-occupying lesion. Patient is comfortable with this plan.

## 2015-12-28 LAB — LIPID PANEL
Cholesterol: 134 mg/dL (ref 125–200)
HDL: 57 mg/dL (ref >=40)
LDL Cholesterol: 63 mg/dL (ref <130)
Total CHOL/HDL Ratio: 2.4 Ratio (ref <=5.0)
Triglycerides: 71 mg/dL (ref <150)
VLDL: 14 mg/dL (ref <30)

## 2015-12-28 LAB — COMPLETE METABOLIC PANEL WITH GFR
ALT: 23 U/L (ref 9–46)
AST: 23 U/L (ref 10–35)
Albumin: 4.4 g/dL (ref 3.6–5.1)
Alkaline Phosphatase: 59 U/L (ref 40–115)
BUN: 17 mg/dL (ref 7–25)
CO2: 28 mmol/L (ref 20–31)
Calcium: 9.4 mg/dL (ref 8.6–10.3)
Chloride: 101 mmol/L (ref 98–110)
Creat: 1.2 mg/dL — ABNORMAL HIGH (ref 0.70–1.18)
GFR, Est African American: 70 mL/min (ref >=60)
GFR, Est Non African American: 61 mL/min (ref >=60)
Glucose, Bld: 137 mg/dL — ABNORMAL HIGH (ref 70–99)
Potassium: 4.7 mmol/L (ref 3.5–5.3)
Sodium: 139 mmol/L (ref 135–146)
Total Bilirubin: 1 mg/dL (ref 0.2–1.2)
Total Protein: 7.2 g/dL (ref 6.1–8.1)

## 2015-12-28 LAB — HEMOGLOBIN A1C
Hgb A1c MFr Bld: 6.5 % — ABNORMAL HIGH (ref <5.7)
Mean Plasma Glucose: 140 mg/dL

## 2015-12-28 LAB — MICROALBUMIN, URINE: Microalb, Ur: 0.3 mg/dL

## 2015-12-29 ENCOUNTER — Encounter: Payer: Self-pay | Admitting: Family Medicine

## 2016-01-03 ENCOUNTER — Encounter: Payer: Self-pay | Admitting: Family Medicine

## 2016-01-03 ENCOUNTER — Ambulatory Visit (INDEPENDENT_AMBULATORY_CARE_PROVIDER_SITE_OTHER): Payer: Medicare Other | Admitting: Family Medicine

## 2016-01-03 VITALS — BP 136/70 | HR 88 | Temp 98.3°F | Resp 18 | Ht 69.0 in | Wt 206.0 lb

## 2016-01-03 DIAGNOSIS — G500 Trigeminal neuralgia: Secondary | ICD-10-CM | POA: Diagnosis not present

## 2016-01-03 MED ORDER — CARBAMAZEPINE 200 MG PO TABS: 100.0000 mg | ORAL_TABLET | Freq: Two times a day (BID) | ORAL | 2 refills | Status: DC

## 2016-01-03 NOTE — Progress Notes (Signed)
Subjective:    Patient ID: James Harper, male    DOB: [birth year], 69 y.o.   MRN: [id number]  HPI 07/2013 When the patient was here for his last medicine check, I discovered that he had mild elevations in his liver function test. I therefore performed a viral hepatitis panel which returned positive for antibodies to hepatitis C. I confirmed the presence of active bowel hepatitis C by checking a viral load. Patient's genotype returned positive for type IA.  At that time, my plan was: 1. Hep C w/o coma, chronic I referred the patient to hepatitis C clinic. I recommended avoidance of alcohol. Recheck fasting lipid panel and Hg A1c in 3-6 months. - Ambulatory referral to Gastroenterology  12/27/2015 Has not been seen for follow up of his chronic medical conditions since.  The patient has been checking his blood sugar at home.  Fasting sugars can range from 130-150.  2 hour post prandial sugars are <120.  Denies CP/SOB/DOE.  Denies polyyuria, polydypsia, or blurry vision.  Refuses flu shot and pneumovax.  Would like PSA.  For the last 4 or 5 weeks, the patient has had right-sided headache. It is primarily centered in his right frontal sinus and right maxillary sinus. He describes it as a constant pressure. He also reports pressure and pain in his right ear and in his right jaw. Occasionally the pain will radiate into his right occiput. He also reports possibly some mild blurry vision in his right eye. He denies any cough or sore throat or fever or postnasal drip. However he is tender to percussion over his right maxillary sinus.  At that time, my plan was: The patient's blood pressure is acceptable today. I will check a fasting lipid panel. Goal LDL cholesterol is less than 100. I will also check a microalbumin and hemoglobin A1c. While checking fasting lab work I will also check a PSA. He declines the flu shot and Pneumovax. His history is concerning for a sinus infection although the history is also  somewhat atypical for sinus infection given the lack of fever and rhinorrhea. Symptoms do not sound like a stroke or an aneurysm. However I cannot rule out space-occupying lesion inside the skull. Therefore we are going to try high-dose antibiotics for sinus infection for 10 days using Omnicef 300 mg by mouth twice a day. If symptoms are not improving by next week, we are going to proceed with a CT scan of the head to rule out space-occupying lesion. Patient is comfortable with this plan.  01/03/16 The patient's pain is worsening. He now has pain shooting from his ear to his right eye and also pain shooting from his ear to his nostril on the right side. This morning he developed a shooting stabbing pain radiating from his ear down his mandible all the way to his chin. He also complains of some numbness and tingling and burning on the right side of his face. His symptoms are consistent with neuropathic pain and are following the distribution of the trigeminal nerve. Of note there is no facial palsy. There is no facial paralysis. Cranial nerves II through XII are grossly intact with muscle strength 5 over 5 equal and symmetric in the upper and lower extremities. His exam today is completely normal Past Medical History:  Diagnosis Date  . GERD (gastroesophageal reflux disease)   . Hep C w/o coma, chronic ([place])   . Hypertension   . Prediabetes    No past surgical history on file. Current  Outpatient Prescriptions on File Prior to Visit  Medication Sig Dispense Refill  . cefdinir (OMNICEF) 300 MG capsule Take 1 capsule (300 mg total) by mouth 2 (two) times daily. 20 capsule 0  . losartan-hydrochlorothiazide (HYZAAR) 50-12.5 MG tablet TAKE 1 TABLET BY MOUTH EVERY DAY 90 tablet 1  . pantoprazole (PROTONIX) 40 MG tablet TAKE 1 TABLET BY MOUTH EVERY DAY 30 tablet 11   No current facility-administered medications on file prior to visit.    Allergies  Allergen Reactions  . Omnipaque [Iohexol] Nausea And  Vomiting    Pt states he has severe vomiting with IV contrast, especially the older kind he had over 20 yrs ago.  He had it one other time, it was injected slower and he said he did better.  . Norvasc [Amlodipine Besylate] Swelling   Social History   Social History  . Marital status: Married    Spouse name: N/A  . Number of children: N/A  . Years of education: N/A   Occupational History  . Not on file.   Social History Main Topics  . Smoking status: Former Smoker    Quit date: 08/19/1972  . Smokeless tobacco: Never Used  . Alcohol use No  . Drug use: No  . Sexual activity: Not on file   Other Topics Concern  . Not on file   Social History Narrative  . No narrative on file     Review of Systems  All other systems reviewed and are negative.      Objective:   Physical Exam  Constitutional: He is oriented to person, place, and time. He appears well-developed and well-nourished. No distress.  HENT:  Right Ear: Tympanic membrane, external ear and ear canal normal.  Left Ear: Tympanic membrane, external ear and ear canal normal.  Nose: No mucosal edema or rhinorrhea. Right sinus exhibits no maxillary sinus tenderness and no frontal sinus tenderness. Left sinus exhibits no maxillary sinus tenderness and no frontal sinus tenderness.  Mouth/Throat: Oropharynx is clear and moist. No oropharyngeal exudate.  Neck: Neck supple. No JVD present.  Cardiovascular: Normal rate, regular rhythm and normal heart sounds.   No murmur heard. Pulmonary/Chest: Effort normal and breath sounds normal. No respiratory distress. He has no wheezes. He has no rales.  Abdominal: Soft. Bowel sounds are normal.  Musculoskeletal: He exhibits no edema.  Lymphadenopathy:    He has no cervical adenopathy.  Neurological: He is alert and oriented to person, place, and time. He has normal reflexes. No cranial nerve deficit. He exhibits normal muscle tone. Coordination normal.  Skin: He is not diaphoretic.    Vitals reviewed.         Assessment & Plan:   Trigeminal neuralgia of right side of face - Plan: carbamazepine (TEGRETOL) 200 MG tablet, MR Brain W Wo Contrast  I believe a misdiagnosed the patient. I do not think this is a sinus infection any longer. I believe he has trigeminal neuralgia with pain in all 3 distributions. I will start the patient on Tegretol 100 mg twice daily and have him increase it to 200 mg twice daily in one week. Meanwhile I will schedule an MRI of the brain to evaluate further for possible etiologies of the trigeminal neuralgia

## 2016-01-18 ENCOUNTER — Ambulatory Visit
Admission: RE | Admit: 2016-01-18 | Discharge: 2016-01-18 | Disposition: A | Discharge status: Home / Self Care | Payer: Medicare Other | Source: Ambulatory Visit | Attending: Family Medicine | Admitting: Family Medicine

## 2016-01-18 ENCOUNTER — Other Ambulatory Visit: Payer: Self-pay | Admitting: Family Medicine

## 2016-01-18 DIAGNOSIS — M795 Residual foreign body in soft tissue: Secondary | ICD-10-CM

## 2016-01-18 DIAGNOSIS — G500 Trigeminal neuralgia: Secondary | ICD-10-CM

## 2016-01-18 MED ORDER — GADOBENATE DIMEGLUMINE 529 MG/ML IV SOLN
20.0000 mL | Freq: Once | INTRAVENOUS | Status: AC | PRN
  Administered 2016-01-18: 20 mL via INTRAVENOUS

## 2016-02-06 ENCOUNTER — Other Ambulatory Visit: Payer: Self-pay | Admitting: Family Medicine

## 2016-08-09 ENCOUNTER — Other Ambulatory Visit: Payer: Self-pay | Admitting: Family Medicine

## 2016-10-04 ENCOUNTER — Other Ambulatory Visit: Payer: Self-pay | Admitting: Family Medicine

## 2016-10-05 ENCOUNTER — Other Ambulatory Visit: Payer: Self-pay | Admitting: Family Medicine

## 2016-10-05 MED ORDER — LOSARTAN POTASSIUM-HCTZ 100-25 MG PO TABS: ORAL_TABLET | ORAL | 1 refills | Status: DC

## 2017-01-17 ENCOUNTER — Ambulatory Visit (INDEPENDENT_AMBULATORY_CARE_PROVIDER_SITE_OTHER): Payer: PPO | Admitting: Family Medicine

## 2017-01-17 ENCOUNTER — Ambulatory Visit (HOSPITAL_COMMUNITY)
Admission: RE | Admit: 2017-01-17 | Discharge: 2017-01-17 | Disposition: A | Discharge status: Home / Self Care | Payer: PPO | Source: Ambulatory Visit | Attending: Family Medicine | Admitting: Family Medicine

## 2017-01-17 ENCOUNTER — Encounter: Payer: Self-pay | Admitting: Family Medicine

## 2017-01-17 VITALS — BP 150/80 | HR 70 | Temp 97.7°F | Resp 16 | Ht 69.0 in | Wt 206.0 lb

## 2017-01-17 DIAGNOSIS — R140 Abdominal distension (gaseous): Secondary | ICD-10-CM | POA: Diagnosis not present

## 2017-01-17 DIAGNOSIS — R7303 Prediabetes: Secondary | ICD-10-CM | POA: Diagnosis not present

## 2017-01-17 DIAGNOSIS — Z125 Encounter for screening for malignant neoplasm of prostate: Secondary | ICD-10-CM | POA: Diagnosis not present

## 2017-01-17 DIAGNOSIS — Z23 Encounter for immunization: Secondary | ICD-10-CM | POA: Diagnosis not present

## 2017-01-17 NOTE — Addendum Note (Signed)
Addended by: Shary Decamp B on: 01/17/2017 09:31 AM   Modules accepted: Orders

## 2017-01-17 NOTE — Progress Notes (Signed)
I

## 2017-01-17 NOTE — Progress Notes (Signed)
Subjective:    Patient ID: James Harper, male    DOB: 1945-04-11, 71 y.o.   MRN: [id number]  HPI  The patient is a very pleasant 71 year old white male with a past medical history of hepatitis C posttreatment, hypertension, borderline diabetes mellitus who rarely comes to the doctor.  He states that for the last 2-3 months, he has developed early satiety, abdominal bloating immediately after eating, generalized abdominal distention and abdominal discomfort.  He has reflux on a daily basis refractory to Prilosec or Nexium better with Zantac.  He denies any melena.  He denies any hematochezia.  He denies any nausea or vomiting.  He reports normal bowel movements every day.  He denies any constipation or diarrhea. Wt Readings from Last 3 Encounters:  01/17/17 206 lb (93.4 kg)  01/03/16 206 lb (93.4 kg)  12/27/15 210 lb (95.3 kg)   His weight remains stable [place] to our scales.  His blood pressure today is elevated.  He states that at home, his blood pressure is 130-140/60-70.  He denies any chest pain shortness of breath or dyspnea on exertion.  He is not checking his sugars often but when he does check them his sugars are running 140.  He denies any polyuria, polydipsia, or blurry vision. Past Medical History:  Diagnosis Date  . GERD (gastroesophageal reflux disease)   . Hep C w/o coma, chronic ([place])   . Hypertension   . Prediabetes    No past surgical history on file. Current Outpatient Prescriptions on File Prior to Visit  Medication Sig Dispense Refill  . losartan-hydrochlorothiazide (HYZAAR) 100-25 MG tablet TAKE ONE (1) TABLET BY MOUTH EVERY DAY 90 tablet 1   No current facility-administered medications on file prior to visit.    Allergies  Allergen Reactions  . Omnipaque [Iohexol] Nausea And Vomiting    Pt states he has severe vomiting with IV contrast, especially the older kind he had over 20 yrs ago.  He had it one other time, it was injected slower and he said he did better.    . Norvasc [Amlodipine Besylate] Swelling   Social History   Social History  . Marital status: Married    Spouse name: N/A  . Number of children: N/A  . Years of education: N/A   Occupational History  . Not on file.   Social History Main Topics  . Smoking status: Former Smoker    Quit date: 08/19/1972  . Smokeless tobacco: Never Used  . Alcohol use No  . Drug use: No  . Sexual activity: Not on file   Other Topics Concern  . Not on file   Social History Narrative  . No narrative on file     Review of Systems  All other systems reviewed and are negative.      Objective:   Physical Exam  Constitutional: He appears well-developed and well-nourished.  Cardiovascular: Normal rate, regular rhythm and normal heart sounds.   Pulmonary/Chest: Effort normal and breath sounds normal. No respiratory distress. He has no wheezes. He has no rales.  Abdominal: Soft. He exhibits distension. There is no tenderness. There is no rebound and no guarding.  Musculoskeletal: He exhibits no edema.  Vitals reviewed.         Assessment & Plan:  Prediabetes - Plan: CBC with Differential/Platelet, COMPLETE METABOLIC PANEL WITH GFR, Lipid panel, Hemoglobin A1c, Microalbumin, urine  Abdominal bloating - Plan: DG Abd Acute W/Chest  Prostate cancer screening - Plan: PSA  The blood pressure today  is elevated.  It sounds borderline at home.  I will check a CBC, CMP, fasting lipid panel, hemoglobin A1c, and urine microalbumin to assess management of his diabetes.  Diabetic foot exam is performed today and is normal.  He received his flu shot as well as his pneumonia vaccine.  He is long overdue for a colonoscopy.  I will begin by obtaining an abdominal x-ray to evaluate for constipation, ileus, or gaseous distention.  Differential diagnosis includes chronic constipation, gastroparesis, GERD.  If x-ray is normal, and lab work is unremarkable, I would likely consult GI for an EGD given his early  satiety.  Consider a gastric emptying study as well if the abdominal x-ray is normal.  Goal LDL cholesterol is less than 100.  Goal hemoglobin A1c is less than 6.5.  Await results of the above studies but I will likely suggest adding amlodipine to his blood pressure medicine to get his blood pressure better controlled

## 2017-01-18 LAB — COMPLETE METABOLIC PANEL WITH GFR
AG Ratio: 1.6 (calc) (ref 1.0–2.5)
ALT: 17 U/L (ref 9–46)
AST: 17 U/L (ref 10–35)
Albumin: 4.6 g/dL (ref 3.6–5.1)
Alkaline phosphatase (APISO): 55 U/L (ref 40–115)
BUN/Creatinine Ratio: 15 (calc) (ref 6–22)
BUN: 19 mg/dL (ref 7–25)
CO2: 29 mmol/L (ref 20–32)
Calcium: 9.9 mg/dL (ref 8.6–10.3)
Chloride: 99 mmol/L (ref 98–110)
Creat: 1.26 mg/dL — ABNORMAL HIGH (ref 0.70–1.18)
GFR, Est African American: 66 mL/min/{1.73_m2} (ref >=60)
GFR, Est Non African American: 57 mL/min/{1.73_m2} — ABNORMAL LOW (ref >=60)
Globulin: 2.9 g/dL (calc) (ref 1.9–3.7)
Glucose, Bld: 137 mg/dL — ABNORMAL HIGH (ref 65–99)
Potassium: 4.8 mmol/L (ref 3.5–5.3)
Sodium: 136 mmol/L (ref 135–146)
Total Bilirubin: 1.1 mg/dL (ref 0.2–1.2)
Total Protein: 7.5 g/dL (ref 6.1–8.1)

## 2017-01-18 LAB — LIPID PANEL
Cholesterol: 141 mg/dL (ref <200)
HDL: 60 mg/dL (ref >40)
LDL Cholesterol (Calc): 65 mg/dL (calc)
Non-HDL Cholesterol (Calc): 81 mg/dL (calc) (ref <130)
Total CHOL/HDL Ratio: 2.4 (calc) (ref <5.0)
Triglycerides: 79 mg/dL (ref <150)

## 2017-01-18 LAB — MICROALBUMIN, URINE: Microalb, Ur: 0.8 mg/dL

## 2017-01-18 LAB — CBC WITH DIFFERENTIAL/PLATELET
Basophils Absolute: 50 cells/uL (ref 0–200)
Basophils Relative: 0.8 %
Eosinophils Absolute: 233 cells/uL (ref 15–500)
Eosinophils Relative: 3.7 %
HCT: 46.1 % (ref 38.5–50.0)
Hemoglobin: 15.2 g/dL (ref 13.2–17.1)
Lymphs Abs: 1840 cells/uL (ref 850–3900)
MCH: 29 pg (ref 27.0–33.0)
MCHC: 33 g/dL (ref 32.0–36.0)
MCV: 88 fL (ref 80.0–100.0)
MPV: 10.9 fL (ref 7.5–12.5)
Monocytes Relative: 8.8 %
Neutro Abs: 3623 cells/uL (ref 1500–7800)
Neutrophils Relative %: 57.5 %
Platelets: 251 10*3/uL (ref 140–400)
RBC: 5.24 10*6/uL (ref 4.20–5.80)
RDW: 12.6 % (ref 11.0–15.0)
Total Lymphocyte: 29.2 %
WBC mixed population: 554 cells/uL (ref 200–950)
WBC: 6.3 10*3/uL (ref 3.8–10.8)

## 2017-01-18 LAB — PSA: PSA: 0.2 ng/mL (ref <=4.0)

## 2017-01-18 LAB — HEMOGLOBIN A1C
Hgb A1c MFr Bld: 6.3 % of total Hgb — ABNORMAL HIGH (ref <5.7)
Mean Plasma Glucose: 134 (calc)
eAG (mmol/L): 7.4 (calc)

## 2017-06-06 ENCOUNTER — Other Ambulatory Visit: Payer: Self-pay | Admitting: Family Medicine

## 2017-12-03 ENCOUNTER — Other Ambulatory Visit: Payer: Self-pay | Admitting: Family Medicine

## 2018-03-06 ENCOUNTER — Telehealth: Payer: Self-pay | Admitting: *Deleted

## 2018-03-06 MED ORDER — LOSARTAN POTASSIUM 100 MG PO TABS: 100.0000 mg | ORAL_TABLET | Freq: Every day | ORAL | 3 refills | Status: DC

## 2018-03-06 MED ORDER — HYDROCHLOROTHIAZIDE 25 MG PO TABS: 25.0000 mg | ORAL_TABLET | Freq: Every day | ORAL | 3 refills | Status: DC

## 2018-03-06 NOTE — Telephone Encounter (Signed)
Ok to split

## 2018-03-06 NOTE — Telephone Encounter (Signed)
Received call from pharmacy. Reports that Losartan HCTZ is on indefinite backorder. Requested to split medication to Losartan and HCTZ.   Prescription sent to pharmacy.

## 2018-09-04 ENCOUNTER — Other Ambulatory Visit: Payer: Self-pay | Admitting: *Deleted

## 2018-09-04 ENCOUNTER — Other Ambulatory Visit: Payer: Self-pay | Admitting: Family Medicine

## 2018-10-22 ENCOUNTER — Other Ambulatory Visit: Payer: Self-pay

## 2019-01-15 ENCOUNTER — Other Ambulatory Visit: Payer: Self-pay

## 2019-01-15 ENCOUNTER — Ambulatory Visit (INDEPENDENT_AMBULATORY_CARE_PROVIDER_SITE_OTHER): Payer: PPO | Admitting: Family Medicine

## 2019-01-15 VITALS — BP 120/70 | HR 78 | Temp 97.8°F | Resp 14 | Ht 69.0 in | Wt 203.0 lb

## 2019-01-15 DIAGNOSIS — E1122 Type 2 diabetes mellitus with diabetic chronic kidney disease: Secondary | ICD-10-CM | POA: Diagnosis not present

## 2019-01-15 DIAGNOSIS — R0609 Other forms of dyspnea: Secondary | ICD-10-CM

## 2019-01-15 DIAGNOSIS — Z23 Encounter for immunization: Secondary | ICD-10-CM

## 2019-01-15 DIAGNOSIS — E1165 Type 2 diabetes mellitus with hyperglycemia: Secondary | ICD-10-CM | POA: Diagnosis not present

## 2019-01-15 DIAGNOSIS — R0600 Dyspnea, unspecified: Secondary | ICD-10-CM

## 2019-01-15 DIAGNOSIS — Z8619 Personal history of other infectious and parasitic diseases: Secondary | ICD-10-CM | POA: Diagnosis not present

## 2019-01-15 DIAGNOSIS — N182 Chronic kidney disease, stage 2 (mild): Secondary | ICD-10-CM | POA: Diagnosis not present

## 2019-01-15 DIAGNOSIS — IMO0002 Reserved for concepts with insufficient information to code with codable children: Secondary | ICD-10-CM

## 2019-01-15 MED ORDER — METOPROLOL SUCCINATE ER 25 MG PO TB24: 25.0000 mg | ORAL_TABLET | Freq: Every day | ORAL | 3 refills | Status: DC

## 2019-01-15 MED ORDER — LOSARTAN POTASSIUM 100 MG PO TABS: 100.0000 mg | ORAL_TABLET | Freq: Every day | ORAL | 3 refills | Status: DC

## 2019-01-15 NOTE — Progress Notes (Signed)
Subjective:    Patient ID: James Harper, male    DOB: 02/18/1946, 73 y.o.   MRN: [id number]  HPI  Patient has not been seen in 2 years.  At his last office visit he was a borderline prediabetic with a hemoglobin A1c of 6.3.  He states that over the last year his blood sugars have been greater than 150 and recently his blood sugars have been greater than 170.  He returns today for lab work to evaluate his diabetes.  However he also reports dyspnea on exertion.  He states that he can do very little and he becomes extremely winded and have to stop.  He continues to try to work and push it, he will start to feel sick and achy.  When I asked the patient specifically if he develops chest pain, the patient does report a discomfort in the center of his chest with activity that improves with rest.  It is short and mild and vague.  He denies any intense pressure-like sensation similar to an elephant on his chest.  However he does report a discomfort in the substernal area with activity that has been associated with the dyspnea on exertion.  He has numerous risk factors including uncontrolled diabetes mellitus as well as age.  He also has a history of hypertension.  On exam today, the patient is also having occasional PVCs.  He states that the PVCs have worsened recently.  In the past he tolerated metoprolol. Past Medical History:  Diagnosis Date  . GERD (gastroesophageal reflux disease)   . Hep C w/o coma, chronic ([place])   . Hypertension   . Prediabetes    No past surgical history on file. No current outpatient medications on file prior to visit.   No current facility-administered medications on file prior to visit.    Allergies  Allergen Reactions  . Omnipaque [Iohexol] Nausea And Vomiting    Pt states he has severe vomiting with IV contrast, especially the older kind he had over 20 yrs ago.  He had it one other time, it was injected slower and he said he did better.  . Norvasc [Amlodipine Besylate]  Swelling   Social History   Socioeconomic History  . Marital status: Married    Spouse name: Not on file  . Number of children: Not on file  . Years of education: Not on file  . Highest education level: Not on file  Occupational History  . Not on file  Social Needs  . Financial resource strain: Not on file  . Food insecurity    Worry: Not on file    Inability: Not on file  . Transportation needs    Medical: Not on file    Non-medical: Not on file  Tobacco Use  . Smoking status: Former Smoker    Quit date: 08/19/1972    Years since quitting: 46.4  . Smokeless tobacco: Never Used  Substance and Sexual Activity  . Alcohol use: No  . Drug use: No  . Sexual activity: Not on file  Lifestyle  . Physical activity    Days per week: Not on file    Minutes per session: Not on file  . Stress: Not on file  Relationships  . Social Herbalist on phone: Not on file    Gets together: Not on file    Attends religious service: Not on file    Active member of club or organization: Not on file    Attends  meetings of clubs or organizations: Not on file    Relationship status: Not on file  . Intimate partner violence    Fear of current or ex partner: Not on file    Emotionally abused: Not on file    Physically abused: Not on file    Forced sexual activity: Not on file  Other Topics Concern  . Not on file  Social History Narrative  . Not on file    Review of Systems  All other systems reviewed and are negative.      Objective:   Physical Exam Vitals signs reviewed.  Constitutional:      General: He is not in acute distress.    Appearance: Normal appearance. He is not ill-appearing, toxic-appearing or diaphoretic.  Cardiovascular:     Rate and Rhythm: Normal rate and regular rhythm.     Pulses: Normal pulses.     Heart sounds: Normal heart sounds. No murmur. No friction rub. No gallop.   Pulmonary:     Effort: Pulmonary effort is normal. No respiratory distress.      Breath sounds: Normal breath sounds. No stridor. No wheezing, rhonchi or rales.  Abdominal:     General: Abdomen is flat. Bowel sounds are normal. There is no distension.     Palpations: Abdomen is soft. There is no mass.     Tenderness: There is no abdominal tenderness. There is no guarding or rebound.     Hernia: No hernia is present.  Musculoskeletal:     Right lower leg: No edema.     Left lower leg: No edema.  Neurological:     Mental Status: He is alert.           Assessment & Plan:  Uncontrolled diabetes mellitus with stage 2 chronic kidney disease, without long-term current use of insulin ([place]) - Plan: Hemoglobin A1c, COMPLETE METABOLIC PANEL WITH GFR, CBC with Differential/Platelet, Lipid panel, Microalbumin, urine, Ambulatory referral to Cardiology  History of hepatitis C - Plan: HCV-RNA, Quant Real-Time PCR w/reflex  Needs flu shot - Plan: Flu Vaccine QUAD High Dose(Fluad)  Dyspnea on exertion - Plan: Ambulatory referral to Cardiology  Patient's diabetes sounds uncontrolled.  I would like to obtain a CBC today, CMP, fasting lipid panel, urine microalbumin, and hemoglobin A1c.  His goal hemoglobin A1c is less than 6.5.  If greater than 6.5 I will likely start metformin.  I will also likely recommend a statin as long as his liver function tests are stable.  Given his history of hepatitis C I will also check a hepatitis C viral RNA quant.  He is underwent treatment and I want to ensure that he is in remission/cure.  He received his flu shot today.  However I am also concerned about his dyspnea on exertion and chest pain which to me sounds like possible angina.  I recommended a cardiology consultation for a stress test given his numerous risk factors.  Meanwhile discontinue Hyzaar.  Replace with losartan 100 mg a day and add Toprol-XL 25 mg a day to reduce the frequency of PVCs and in case the patient has coronary artery disease.  Also recommend aspirin 81 mg a day

## 2019-01-16 ENCOUNTER — Other Ambulatory Visit: Payer: Self-pay

## 2019-01-16 MED ORDER — ASPIRIN 81 MG PO CHEW: 81.0000 mg | CHEWABLE_TABLET | Freq: Every day | ORAL | 0 refills | Status: DC

## 2019-01-16 MED ORDER — ATORVASTATIN CALCIUM 20 MG PO TABS: 20.0000 mg | ORAL_TABLET | Freq: Every day | ORAL | 0 refills | Status: DC

## 2019-01-16 MED ORDER — METFORMIN HCL 500 MG PO TABS: 500.0000 mg | ORAL_TABLET | Freq: Two times a day (BID) | ORAL | 0 refills | Status: DC

## 2019-01-21 LAB — HCV RNA, QUANT REAL-TIME PCR W/REFLEX
HCV RNA, PCR, QN (Log): <1.18 LogIU/mL
HCV RNA, PCR, QN: <15 IU/mL

## 2019-01-21 LAB — LIPID PANEL
Cholesterol: 176 mg/dL (ref <200)
HDL: 56 mg/dL (ref >=40)
LDL Cholesterol (Calc): 100 mg/dL (calc) — ABNORMAL HIGH
Non-HDL Cholesterol (Calc): 120 mg/dL (calc) (ref <130)
Total CHOL/HDL Ratio: 3.1 (calc) (ref <5.0)
Triglycerides: 100 mg/dL (ref <150)

## 2019-01-21 LAB — MICROALBUMIN, URINE: Microalb, Ur: 0.9 mg/dL

## 2019-01-21 LAB — HEMOGLOBIN A1C
Hgb A1c MFr Bld: 6.5 % of total Hgb — ABNORMAL HIGH (ref <5.7)
Mean Plasma Glucose: 140 (calc)
eAG (mmol/L): 7.7 (calc)

## 2019-01-21 LAB — CBC WITH DIFFERENTIAL/PLATELET
Absolute Monocytes: 655 cells/uL (ref 200–950)
Basophils Absolute: 51 cells/uL (ref 0–200)
Basophils Relative: 0.6 %
Eosinophils Absolute: 170 cells/uL (ref 15–500)
Eosinophils Relative: 2 %
HCT: 47 % (ref 38.5–50.0)
Hemoglobin: 15.7 g/dL (ref 13.2–17.1)
Lymphs Abs: 1607 cells/uL (ref 850–3900)
MCH: 29.1 pg (ref 27.0–33.0)
MCHC: 33.4 g/dL (ref 32.0–36.0)
MCV: 87 fL (ref 80.0–100.0)
MPV: 11.6 fL (ref 7.5–12.5)
Monocytes Relative: 7.7 %
Neutro Abs: 6018 cells/uL (ref 1500–7800)
Neutrophils Relative %: 70.8 %
Platelets: 248 10*3/uL (ref 140–400)
RBC: 5.4 10*6/uL (ref 4.20–5.80)
RDW: 12.7 % (ref 11.0–15.0)
Total Lymphocyte: 18.9 %
WBC: 8.5 10*3/uL (ref 3.8–10.8)

## 2019-01-21 LAB — COMPLETE METABOLIC PANEL WITH GFR
AG Ratio: 1.5 (calc) (ref 1.0–2.5)
ALT: 16 U/L (ref 9–46)
AST: 16 U/L (ref 10–35)
Albumin: 4.7 g/dL (ref 3.6–5.1)
Alkaline phosphatase (APISO): 55 U/L (ref 35–144)
BUN: 22 mg/dL (ref 7–25)
CO2: 25 mmol/L (ref 20–32)
Calcium: 10.4 mg/dL — ABNORMAL HIGH (ref 8.6–10.3)
Chloride: 100 mmol/L (ref 98–110)
Creat: 1.08 mg/dL (ref 0.70–1.18)
GFR, Est African American: 78 mL/min/{1.73_m2} (ref >=60)
GFR, Est Non African American: 68 mL/min/{1.73_m2} (ref >=60)
Globulin: 3.1 g/dL (calc) (ref 1.9–3.7)
Glucose, Bld: 148 mg/dL — ABNORMAL HIGH (ref 65–99)
Potassium: 4.2 mmol/L (ref 3.5–5.3)
Sodium: 138 mmol/L (ref 135–146)
Total Bilirubin: 0.9 mg/dL (ref 0.2–1.2)
Total Protein: 7.8 g/dL (ref 6.1–8.1)

## 2019-02-02 ENCOUNTER — Encounter: Payer: Self-pay | Admitting: Cardiology

## 2019-02-03 NOTE — Progress Notes (Signed)
Primary Physician/Referring:  Susy Frizzle, MD  Patient ID: James Harper, male    DOB: 20-Oct-1945, 73 y.o.   MRN: [id number]  Chief Complaint  Patient presents with  . Diabetes  . New Patient (Initial Visit)  . Shortness of Breath   HPI:    James Harper  is a 73 y.o. Caucasian male with prediabetes mellitus, hypertension, 40+ year history of tobacco use quit in 1974, referred to me for evaluation of chest tightness, shortness of breath and dyspnea on exertion and abnormal EKG with frequent PVCs.  Patient had not seen his PCP for at least 2 years.  During recent evaluation on 01/15/2019, due to multiple cardiovascular risk factors and suspicion for angina pectoris he was started on a beta blocker and aspirin and referred to [place] for further evaluation.   Patient has had chronic palpitations, recently has noticed worsening palpitations in the form of skipped beats.  No rapid heartbeat.  He also states that over the past few months he has noticed marked dyspnea even walking in his driveway and at times she is also associated with chest tightness in the middle of the chest, he has to sit down and rest for 5-10 minutes with relief.  No PND or orthopnea.  No leg edema.  Denies symptoms of claudication.  Past Medical History:  Diagnosis Date  . GERD (gastroesophageal reflux disease)   . Hep C w/o coma, chronic ([place])   . Hypertension   . Prediabetes    Past Surgical History:  Procedure Laterality Date  . BACK SURGERY  1980   Social History   Socioeconomic History  . Marital status: Married    Spouse name: Not on file  . Number of children: 2  . Years of education: Not on file  . Highest education level: Not on file  Occupational History  . Not on file  Social Needs  . Financial resource strain: Not on file  . Food insecurity    Worry: Not on file    Inability: Not on file  . Transportation needs    Medical: Not on file    Non-medical: Not on file  Tobacco Use  . Smoking  status: Former Smoker    Packs/day: 0.25    Years: 15.00    Pack years: 3.75    Types: Cigarettes    Quit date: 08/19/1972    Years since quitting: 46.4  . Smokeless tobacco: Never Used  Substance and Sexual Activity  . Alcohol use: Yes    Comment: occ  . Drug use: No  . Sexual activity: Not on file  Lifestyle  . Physical activity    Days per week: Not on file    Minutes per session: Not on file  . Stress: Not on file  Relationships  . Social Herbalist on phone: Not on file    Gets together: Not on file    Attends religious service: Not on file    Active member of club or organization: Not on file    Attends meetings of clubs or organizations: Not on file    Relationship status: Not on file  . Intimate partner violence    Fear of current or ex partner: Not on file    Emotionally abused: Not on file    Physically abused: Not on file    Forced sexual activity: Not on file  Other Topics Concern  . Not on file  Social History Narrative  . Not on  file   ROS  Review of Systems  Constitution: Negative for chills, decreased appetite, malaise/fatigue and weight gain.  Cardiovascular: Positive for chest pain, dyspnea on exertion and irregular heartbeat. Negative for leg swelling and syncope.  Endocrine: Negative for cold intolerance.  Hematologic/Lymphatic: Does not bruise/bleed easily.  Musculoskeletal: Negative for joint swelling.  Gastrointestinal: Negative for abdominal pain, anorexia, change in bowel habit, hematochezia and melena.  Neurological: Negative for headaches and light-headedness.  Psychiatric/Behavioral: Negative for depression and substance abuse.  All other systems reviewed and are negative.  Objective   Vitals with BMI 02/04/2019 01/15/2019 01/17/2017  Height 5\' 9"  5\' 9"  5\' 9"   Weight 207 lbs 203 lbs 206 lbs  BMI 30.55 [id number]  Systolic 0000000 [id number]  Diastolic 80 70 80  Pulse 71 78 70      Physical Exam  Constitutional:  He is well  built and mildly obese in no acute distress.  HENT:  Head: Atraumatic.  Eyes: Conjunctivae are normal.  Neck: Neck supple. No JVD present. No thyromegaly present.  Cardiovascular: Normal rate, regular rhythm, normal heart sounds and intact distal pulses. Exam reveals no gallop.  No murmur heard. No leg edema, no JVD.  Pulmonary/Chest: Effort normal and breath sounds normal.  Abdominal: Soft. Bowel sounds are normal.  Musculoskeletal: Normal range of motion.  Neurological: He is alert.  Skin: Skin is warm and dry.  Psychiatric: He has a normal mood and affect.    Laboratory examination:    Recent Labs    01/15/19 1030  NA 138  K 4.2  CL 100  CO2 25  GLUCOSE 148*  BUN 22  CREATININE 1.08  CALCIUM 10.4*  GFRNONAA 68  GFRAA 78   estimated creatinine clearance is 68.9 mL/min (by C-G formula based on SCr of 1.08 mg/dL).  CMP Latest Ref Rng & Units 01/15/2019 01/17/2017 12/27/2015  Glucose 65 - 99 mg/dL 148(H) 137(H) 137(H)  BUN 7 - 25 mg/dL 22 19 17   Creatinine 0.70 - 1.18 mg/dL 1.08 1.26(H) 1.20(H)  Sodium 135 - 146 mmol/L 138 136 139  Potassium 3.5 - 5.3 mmol/L 4.2 4.8 4.7  Chloride 98 - 110 mmol/L 100 99 101  CO2 20 - 32 mmol/L 25 29 28   Calcium 8.6 - 10.3 mg/dL 10.4(H) 9.9 9.4  Total Protein 6.1 - 8.1 g/dL 7.8 7.5 7.2  Total Bilirubin 0.2 - 1.2 mg/dL 0.9 1.1 1.0  Alkaline Phos 40 - 115 U/L - - 59  AST 10 - 35 U/L 16 17 23   ALT 9 - 46 U/L 16 17 23    CBC Latest Ref Rng & Units 01/15/2019 01/17/2017 12/27/2015  WBC 3.8 - 10.8 Thousand/uL 8.5 6.3 7.1  Hemoglobin 13.2 - 17.1 g/dL 15.7 15.2 15.3  Hematocrit 38.5 - 50.0 % 47.0 46.1 45.2  Platelets 140 - 400 Thousand/uL 248 251 229   Lipid Panel     Component Value Date/Time   CHOL 176 01/15/2019 1030   TRIG 100 01/15/2019 1030   HDL 56 01/15/2019 1030   CHOLHDL 3.1 01/15/2019 1030   VLDL 14 12/27/2015 0818   LDLCALC 100 (H) 01/15/2019 1030   HEMOGLOBIN A1C Lab Results  Component Value Date   HGBA1C 6.5 (H)  01/15/2019   MPG 140 01/15/2019   TSH No results for input(s): TSH in the last 8760 hours. Medications and allergies   Allergies  Allergen Reactions  . Omnipaque [Iohexol] Nausea And Vomiting    Pt states he has severe vomiting with IV contrast, especially the  older kind he had over 20 yrs ago.  He had it one other time, it was injected slower and he said he did better.  Marland Kitchen Norvasc [Amlodipine Besylate] Swelling     Current Outpatient Medications  Medication Instructions  . aspirin 81 mg, Oral, Daily  . atorvastatin (LIPITOR) 20 mg, Oral, Daily  . isosorbide mononitrate (IMDUR) 60 mg, Oral, Daily  . losartan (COZAAR) 100 mg, Oral, Daily  . metFORMIN (GLUCOPHAGE) 500 mg, Oral, 2 times daily with meals  . metoprolol succinate (TOPROL-XL) 25 mg, Oral, Daily   Radiology:  No results found. Cardiac Studies:   None  Assessment     ICD-10-CM   1. Angina pectoris (HCC)  I20.9 EKG 12-Lead    PCV MYOCARDIAL PERFUSION WO LEXISCAN    PCV ECHOCARDIOGRAM COMPLETE    isosorbide mononitrate (IMDUR) 60 MG 24 hr tablet  2. Frequent PVCs  I49.3   3. Dyspnea on exertion  R06.00 DG Chest 2 View    PCV MYOCARDIAL PERFUSION WO LEXISCAN    PCV ECHOCARDIOGRAM COMPLETE  4. Controlled type 2 diabetes mellitus without complication, without long-term current use of insulin (HCC)  E11.9   5. Hypercholesteremia  E78.00     EKG 02/04/2019: Normal sinus rhythm at rate of 69 bpm, normal axis.  No evidence of ischemia, normal EKG.  Compared to 01/19/2005, no significant change.   Recommendations:   Meds ordered this encounter  Medications  . isosorbide mononitrate (IMDUR) 60 MG 24 hr tablet    Sig: Take 1 tablet (60 mg total) by mouth daily.    Dispense:  30 tablet    Refill:  2    Specimen.  Hypertension, recent diagnosis of hyperlipidemia and also diabetes mellitus, chronic palpitations felt to be due to PVCs recently worse, seen by Dr. Dennard Schaumann and started on beta blocker therapy, referred to me  for evaluation of exertional chest pain and also dyspnea.  Symptoms are very concerning for angina pectoris, his activity level has markedly decreased, even walking in his driveway he gets markedly dyspneic.  He also has associated chest tightness in the middle of the chest which is relieved with rest.  I have added isosorbide mononitrate 60 mg daily.  He'll continue with aspirin.He is already on a good doses of statins that was started recently and also metformin for diabetes.  Schedule for a Exercise Nuclear stress test to evaluate for myocardial ischemia. Will schedule for an echocardiogram. Will also obtain CXR. Office visit following the work-up/investigations. Weight loss discussed.  Adrian Prows, MD, [facility] 02/04/2019, 10:42 AM [facility]. [place] Pager: [phone] Office: [phone] If no answer Cell [phone]

## 2019-02-04 ENCOUNTER — Ambulatory Visit (INDEPENDENT_AMBULATORY_CARE_PROVIDER_SITE_OTHER): Payer: PPO | Admitting: Cardiology

## 2019-02-04 ENCOUNTER — Other Ambulatory Visit: Payer: Self-pay

## 2019-02-04 ENCOUNTER — Encounter: Payer: Self-pay | Admitting: Cardiology

## 2019-02-04 VITALS — BP 162/80 | HR 71 | Ht 69.0 in | Wt 207.0 lb

## 2019-02-04 DIAGNOSIS — E119 Type 2 diabetes mellitus without complications: Secondary | ICD-10-CM

## 2019-02-04 DIAGNOSIS — I493 Ventricular premature depolarization: Secondary | ICD-10-CM

## 2019-02-04 DIAGNOSIS — E7800 Pure hypercholesterolemia, unspecified: Secondary | ICD-10-CM | POA: Diagnosis not present

## 2019-02-04 DIAGNOSIS — I209 Angina pectoris, unspecified: Secondary | ICD-10-CM

## 2019-02-04 DIAGNOSIS — R0609 Other forms of dyspnea: Secondary | ICD-10-CM

## 2019-02-04 DIAGNOSIS — R0600 Dyspnea, unspecified: Secondary | ICD-10-CM

## 2019-02-04 MED ORDER — ISOSORBIDE MONONITRATE ER 60 MG PO TB24: 60.0000 mg | ORAL_TABLET | Freq: Every day | ORAL | 2 refills | Status: DC

## 2019-02-04 NOTE — Patient Instructions (Signed)
Do not take Toprol one day before or the day of the test

## 2019-02-11 ENCOUNTER — Ambulatory Visit (INDEPENDENT_AMBULATORY_CARE_PROVIDER_SITE_OTHER): Payer: PPO

## 2019-02-11 ENCOUNTER — Other Ambulatory Visit: Payer: Self-pay

## 2019-02-11 DIAGNOSIS — R0609 Other forms of dyspnea: Secondary | ICD-10-CM

## 2019-02-11 DIAGNOSIS — R0600 Dyspnea, unspecified: Secondary | ICD-10-CM

## 2019-02-11 DIAGNOSIS — I209 Angina pectoris, unspecified: Secondary | ICD-10-CM

## 2019-02-12 ENCOUNTER — Ambulatory Visit (INDEPENDENT_AMBULATORY_CARE_PROVIDER_SITE_OTHER): Payer: PPO

## 2019-02-12 DIAGNOSIS — I209 Angina pectoris, unspecified: Secondary | ICD-10-CM

## 2019-02-12 DIAGNOSIS — R0609 Other forms of dyspnea: Secondary | ICD-10-CM

## 2019-02-12 DIAGNOSIS — R0600 Dyspnea, unspecified: Secondary | ICD-10-CM

## 2019-02-23 ENCOUNTER — Ambulatory Visit (HOSPITAL_COMMUNITY)
Admission: RE | Admit: 2019-02-23 | Discharge: 2019-02-23 | Disposition: A | Discharge status: Home / Self Care | Payer: PPO | Source: Ambulatory Visit | Attending: Cardiology | Admitting: Cardiology

## 2019-02-23 ENCOUNTER — Other Ambulatory Visit: Payer: Self-pay

## 2019-02-23 DIAGNOSIS — R0600 Dyspnea, unspecified: Secondary | ICD-10-CM | POA: Insufficient documentation

## 2019-02-23 DIAGNOSIS — R0609 Other forms of dyspnea: Secondary | ICD-10-CM

## 2019-02-23 DIAGNOSIS — R079 Chest pain, unspecified: Secondary | ICD-10-CM | POA: Diagnosis not present

## 2019-02-23 DIAGNOSIS — R0602 Shortness of breath: Secondary | ICD-10-CM | POA: Diagnosis not present

## 2019-03-03 ENCOUNTER — Other Ambulatory Visit: Payer: Self-pay | Admitting: Family Medicine

## 2019-03-17 ENCOUNTER — Ambulatory Visit (INDEPENDENT_AMBULATORY_CARE_PROVIDER_SITE_OTHER): Payer: PPO | Admitting: Cardiology

## 2019-03-17 ENCOUNTER — Encounter: Payer: Self-pay | Admitting: Cardiology

## 2019-03-17 ENCOUNTER — Other Ambulatory Visit: Payer: Self-pay

## 2019-03-17 VITALS — BP 154/84 | HR 68 | Temp 98.3°F | Ht 69.0 in | Wt 201.0 lb

## 2019-03-17 DIAGNOSIS — I209 Angina pectoris, unspecified: Secondary | ICD-10-CM | POA: Diagnosis not present

## 2019-03-17 DIAGNOSIS — R002 Palpitations: Secondary | ICD-10-CM | POA: Diagnosis not present

## 2019-03-17 DIAGNOSIS — I10 Essential (primary) hypertension: Secondary | ICD-10-CM | POA: Diagnosis not present

## 2019-03-17 DIAGNOSIS — I493 Ventricular premature depolarization: Secondary | ICD-10-CM

## 2019-03-17 DIAGNOSIS — I428 Other cardiomyopathies: Secondary | ICD-10-CM | POA: Diagnosis not present

## 2019-03-17 MED ORDER — METOPROLOL SUCCINATE ER 50 MG PO TB24: 50.0000 mg | ORAL_TABLET | Freq: Every day | ORAL | 2 refills | Status: DC

## 2019-03-17 NOTE — Progress Notes (Signed)
Primary Physician/Referring:  Susy Frizzle, MD  Patient ID: James Harper, male    DOB: [birth year], 73 y.o.   MRN: [id number]  Chief Complaint  Patient presents with  . Irregular Heart Beat    pt feels skipping   HPI:    ISHAAN Harper  is a 73 y.o. Caucasian male with prediabetes mellitus, hypertension, 40+ year history of tobacco use quit in 1974, referred to me for evaluation of chest tightness, shortness of breath and dyspnea on exertion and abnormal EKG with frequent PVCs.  Patient had not seen his PCP for at least 2 years.  During recent evaluation on 01/15/2019, due to multiple cardiovascular risk factors and suspicion for angina pectoris he was started on a beta blocker and aspirin and referred to [place] for further evaluation.   Patient has had chronic palpitations in the form of skipped beats.  No rapid heartbeat. On his last office visit he had complained of exertional chest pain while he walks in the driveway with relief addressed.  States that he has not had this episode but continues to have sharp pains in the right chest.  He continues to have fatigue and gradually worsening dyspnea. No PND or orthopnea.  No leg edema.  Denies symptoms of claudication. States that he has started to feel better now and   Past Medical History:  Diagnosis Date  . GERD (gastroesophageal reflux disease)   . Hep C w/o coma, chronic ([place])   . Hypertension   . Prediabetes    Past Surgical History:  Procedure Laterality Date  . BACK SURGERY  1980   Social History   Socioeconomic History  . Marital status: Married    Spouse name: Not on file  . Number of children: 2  . Years of education: Not on file  . Highest education level: Not on file  Occupational History  . Not on file  Tobacco Use  . Smoking status: Former Smoker    Packs/day: 0.25    Years: 15.00    Pack years: 3.75    Types: Cigarettes    Quit date: 08/19/1972    Years since quitting: 46.6  . Smokeless tobacco: Never Used    Substance and Sexual Activity  . Alcohol use: Yes    Comment: occ  . Drug use: No  . Sexual activity: Not on file  Other Topics Concern  . Not on file  Social History Narrative  . Not on file   Social Determinants of Health   Financial Resource Strain:   . Difficulty of Paying Living Expenses: Not on file  Food Insecurity:   . Worried About Charity fundraiser in the Last Year: Not on file  . Ran Out of Food in the Last Year: Not on file  Transportation Needs:   . Lack of Transportation (Medical): Not on file  . Lack of Transportation (Non-Medical): Not on file  Physical Activity:   . Days of Exercise per Week: Not on file  . Minutes of Exercise per Session: Not on file  Stress:   . Feeling of Stress : Not on file  Social Connections:   . Frequency of Communication with Friends and Family: Not on file  . Frequency of Social Gatherings with Friends and Family: Not on file  . Attends Religious Services: Not on file  . Active Member of Clubs or Organizations: Not on file  . Attends Archivist Meetings: Not on file  . Marital Status: Not on file  Intimate Partner Violence:   . Fear of Current or Ex-Partner: Not on file  . Emotionally Abused: Not on file  . Physically Abused: Not on file  . Sexually Abused: Not on file   ROS  Review of Systems  Constitution: Positive for malaise/fatigue. Negative for chills, decreased appetite and weight gain.  Cardiovascular: Positive for chest pain, dyspnea on exertion and irregular heartbeat. Negative for leg swelling and syncope.  Endocrine: Negative for cold intolerance.  Hematologic/Lymphatic: Does not bruise/bleed easily.  Musculoskeletal: Negative for joint swelling.  Gastrointestinal: Negative for abdominal pain, anorexia, change in bowel habit, hematochezia and melena.  Neurological: Negative for headaches and light-headedness.  Psychiatric/Behavioral: Negative for depression and substance abuse.  All other systems  reviewed and are negative.  Objective   Vitals with BMI 03/17/2019 02/04/2019 01/15/2019  Height 5\' 9"  5\' 9"  5\' 9"   Weight 201 lbs 207 lbs 203 lbs  BMI 29.67 [id number]  Systolic 123456 0000000 123456  Diastolic 84 80 70  Pulse 68 71 78    Physical Exam  Constitutional:  He is well built and mildly obese in no acute distress.  HENT:  Head: Atraumatic.  Eyes: Conjunctivae are normal.  Neck: No JVD present. No thyromegaly present.  Cardiovascular: Normal rate, regular rhythm, normal heart sounds and intact distal pulses. Exam reveals no gallop.  No murmur heard. No leg edema, no JVD.  Pulmonary/Chest: Effort normal and breath sounds normal.  Abdominal: Soft. Bowel sounds are normal.  Musculoskeletal:        General: Normal range of motion.     Cervical back: Neck supple.  Neurological: He is alert.  Skin: Skin is warm and dry.  Psychiatric: He has a normal mood and affect.   Laboratory examination:   Recent Labs    01/15/19 1030  NA 138  K 4.2  CL 100  CO2 25  GLUCOSE 148*  BUN 22  CREATININE 1.08  CALCIUM 10.4*  GFRNONAA 68  GFRAA 78   CrCl cannot be calculated (Patient's most recent lab result is older than the maximum 21 days allowed.).  CMP Latest Ref Rng & Units 01/15/2019 01/17/2017 12/27/2015  Glucose 65 - 99 mg/dL 148(H) 137(H) 137(H)  BUN 7 - 25 mg/dL 22 19 17   Creatinine 0.70 - 1.18 mg/dL 1.08 1.26(H) 1.20(H)  Sodium 135 - 146 mmol/L 138 136 139  Potassium 3.5 - 5.3 mmol/L 4.2 4.8 4.7  Chloride 98 - 110 mmol/L 100 99 101  CO2 20 - 32 mmol/L 25 29 28   Calcium 8.6 - 10.3 mg/dL 10.4(H) 9.9 9.4  Total Protein 6.1 - 8.1 g/dL 7.8 7.5 7.2  Total Bilirubin 0.2 - 1.2 mg/dL 0.9 1.1 1.0  Alkaline Phos 40 - 115 U/L - - 59  AST 10 - 35 U/L 16 17 23   ALT 9 - 46 U/L 16 17 23    CBC Latest Ref Rng & Units 01/15/2019 01/17/2017 12/27/2015  WBC 3.8 - 10.8 Thousand/uL 8.5 6.3 7.1  Hemoglobin 13.2 - 17.1 g/dL 15.7 15.2 15.3  Hematocrit 38.5 - 50.0 % 47.0 46.1 45.2    Platelets 140 - 400 Thousand/uL 248 251 229   Lipid Panel     Component Value Date/Time   CHOL 176 01/15/2019 1030   TRIG 100 01/15/2019 1030   HDL 56 01/15/2019 1030   CHOLHDL 3.1 01/15/2019 1030   VLDL 14 12/27/2015 0818   LDLCALC 100 (H) 01/15/2019 1030   HEMOGLOBIN A1C Lab Results  Component Value Date   HGBA1C 6.5 (H)  01/15/2019   MPG 140 01/15/2019   TSH No results for input(s): TSH in the last 8760 hours. Medications and allergies   Allergies  Allergen Reactions  . Omnipaque [Iohexol] Nausea And Vomiting    Pt states he has severe vomiting with IV contrast, especially the older kind he had over 20 yrs ago.  He had it one other time, it was injected slower and he said he did better.  Marland Kitchen Norvasc [Amlodipine Besylate] Swelling     Current Outpatient Medications  Medication Instructions  . aspirin 81 mg, Oral, Daily  . atorvastatin (LIPITOR) 20 mg, Oral, Daily  . losartan (COZAAR) 100 mg, Oral, Daily  . metFORMIN (GLUCOPHAGE) 500 MG tablet TAKE ONE TABLET (500MG  TOTAL) BY MOUTH TWO TIMES DAILY WITH A MEAL  . metoprolol succinate (TOPROL-XL) 50 mg, Oral, Daily   Radiology:  No results found. Cardiac Studies:   Echocardiogram 02/12/2019: Normal left ventricular wall thickness. Left ventricle cavity is normal in size. Mild global hypokinesis with mildly depressed LV systolic function with visual EF 45-50%. Normal global wall motion. Doppler evidence of grade I (impaired) diastolic dysfunction, normal LAP. Calculated EF 44%. Left atrial cavity is mildly dilated. Trileaflet aortic valve. Mild to moderate aortic regurgitation. Mild (Grade I) mitral regurgitation.  Mild tricuspid regurgitation. No evidence of pulmonary hypertension.  Exercise tetrofosmin stress test  02/11/2019: Normal ECG stress. Patient exercised on the Bruce protocol for a total of 4 minutes and 59 seconds, achieving approximately 7.01 METs.  Rare PVC noted during stress test. Stress terminated due  to fatigue. Exercise tolerance reduced.  Normal perfusion. Stress LV EF: 52%.  No previous exam available for comparison.  Assessment     ICD-10-CM   1. Frequent PVCs  I49.3 EKG 12-Lead    metoprolol succinate (TOPROL-XL) 50 MG 24 hr tablet    PCV ECHOCARDIOGRAM COMPLETE  2. Palpitations  R00.2 metoprolol succinate (TOPROL-XL) 50 MG 24 hr tablet  3. Non-ischemic cardiomyopathy (HCC)  I42.8 PCV ECHOCARDIOGRAM COMPLETE  4. Angina pectoris (HCC)  I20.9   5. Primary hypertension  I10 PCV ECHOCARDIOGRAM COMPLETE    EKG 03/17/2019: Possibly sinus rhythm, normal axis, single PVC.  Poor R-wave progression, cannot exclude anteroseptal infarct old.  Single PVC.  Low-voltage complexes.  EKG 02/04/2019: Normal sinus rhythm at rate of 69 bpm, normal axis.  No evidence of ischemia, normal EKG.  Compared to 01/19/2005, no significant change.   Recommendations:   Meds ordered this encounter  Medications  . metoprolol succinate (TOPROL-XL) 50 MG 24 hr tablet    Sig: Take 1 tablet (50 mg total) by mouth daily.    Dispense:  90 tablet    Refill:  2    AVYN HENNESY  is a 73 y.o. Caucasian male with prediabetes mellitus, hypertension, 40+ year history of tobacco use quit in 1974, presents evaluation of chest tightness, shortness of breath and dyspnea on exertion and abnormal EKG with frequent PVCs and palpitations.  Since being on metoprolol symptoms of palpitations are improved.  He could not tolerate Imdur due to low blood pressure and dizziness.  His symptoms of chest pain are extremely difficult to make out whether there truly angina but some of the symptoms that he states with exertional dyspnea and marked fatigue following heavy exertional activity suggest angina. Stress test is reassuring.  His echocardiogram does reveal mildly reduced LVEF, may be related to frequent PVCs.  I will increase the dose of Metoprolol succinate from 25 mg to 50 mg daily, I would like  to repeat echocardiogram in 6  months.  For now will continue to observe with regard to chest pain syndrome, blood pressure is slightly elevated hopefully will also be controlled with increasing the metoprolol dose.  Weight loss has been discussed with the patient.  He may have mild competent of sleep apnea as well.  Adrian Prows, MD, [facility] 03/22/2019, 9:01 AM [facility] [place] Pager: [phone] Office: [phone] If no answer Cell [phone]

## 2019-03-18 ENCOUNTER — Ambulatory Visit: Payer: PPO | Admitting: Cardiology

## 2019-04-20 ENCOUNTER — Other Ambulatory Visit: Payer: Self-pay | Admitting: Family Medicine

## 2019-08-11 ENCOUNTER — Other Ambulatory Visit: Payer: Self-pay | Admitting: Family Medicine

## 2019-09-08 ENCOUNTER — Other Ambulatory Visit: Payer: Self-pay

## 2019-09-08 ENCOUNTER — Ambulatory Visit: Payer: PPO

## 2019-09-08 DIAGNOSIS — I10 Essential (primary) hypertension: Secondary | ICD-10-CM

## 2019-09-08 DIAGNOSIS — I428 Other cardiomyopathies: Secondary | ICD-10-CM | POA: Diagnosis not present

## 2019-09-08 DIAGNOSIS — I493 Ventricular premature depolarization: Secondary | ICD-10-CM

## 2019-09-17 ENCOUNTER — Ambulatory Visit: Payer: PPO | Admitting: Cardiology

## 2019-09-17 ENCOUNTER — Encounter: Payer: Self-pay | Admitting: Cardiology

## 2019-09-17 ENCOUNTER — Telehealth: Payer: Self-pay

## 2019-09-17 ENCOUNTER — Other Ambulatory Visit: Payer: Self-pay

## 2019-09-17 VITALS — BP 165/78 | HR 64 | Resp 15 | Ht 69.0 in | Wt 206.4 lb

## 2019-09-17 DIAGNOSIS — I493 Ventricular premature depolarization: Secondary | ICD-10-CM | POA: Insufficient documentation

## 2019-09-18 ENCOUNTER — Ambulatory Visit: Payer: PPO | Admitting: Cardiology

## 2019-10-08 NOTE — Progress Notes (Signed)
MD Delay,  PATIENT NOT BILLED.  I left message personally for the patient on his phone.   Adrian Prows, MD, [facility] 10/08/2019, 11:38 PM Office: [phone]

## 2019-11-19 DIAGNOSIS — M25532 Pain in left wrist: Secondary | ICD-10-CM | POA: Diagnosis not present

## 2019-11-19 DIAGNOSIS — M65311 Trigger thumb, right thumb: Secondary | ICD-10-CM | POA: Insufficient documentation

## 2019-11-19 DIAGNOSIS — M65331 Trigger finger, right middle finger: Secondary | ICD-10-CM | POA: Insufficient documentation

## 2019-11-19 DIAGNOSIS — M25531 Pain in right wrist: Secondary | ICD-10-CM | POA: Insufficient documentation

## 2019-11-19 DIAGNOSIS — M19031 Primary osteoarthritis, right wrist: Secondary | ICD-10-CM | POA: Diagnosis not present

## 2019-11-19 DIAGNOSIS — M65341 Trigger finger, right ring finger: Secondary | ICD-10-CM | POA: Diagnosis not present

## 2019-11-19 DIAGNOSIS — M65321 Trigger finger, right index finger: Secondary | ICD-10-CM | POA: Diagnosis not present

## 2019-11-19 DIAGNOSIS — M65351 Trigger finger, right little finger: Secondary | ICD-10-CM | POA: Diagnosis not present

## 2019-11-21 ENCOUNTER — Other Ambulatory Visit: Payer: Self-pay | Admitting: Family Medicine

## 2019-11-23 DIAGNOSIS — M65351 Trigger finger, right little finger: Secondary | ICD-10-CM | POA: Diagnosis not present

## 2019-11-23 DIAGNOSIS — M65331 Trigger finger, right middle finger: Secondary | ICD-10-CM | POA: Diagnosis not present

## 2019-11-23 DIAGNOSIS — M19031 Primary osteoarthritis, right wrist: Secondary | ICD-10-CM | POA: Diagnosis not present

## 2019-11-23 DIAGNOSIS — M65341 Trigger finger, right ring finger: Secondary | ICD-10-CM | POA: Diagnosis not present

## 2019-11-23 DIAGNOSIS — M65321 Trigger finger, right index finger: Secondary | ICD-10-CM | POA: Diagnosis not present

## 2019-11-24 ENCOUNTER — Other Ambulatory Visit: Payer: Self-pay

## 2019-11-24 MED ORDER — METFORMIN HCL 500 MG PO TABS: ORAL_TABLET | ORAL | 3 refills | Status: DC

## 2019-12-10 ENCOUNTER — Other Ambulatory Visit: Payer: Self-pay | Admitting: Cardiology

## 2019-12-10 DIAGNOSIS — I493 Ventricular premature depolarization: Secondary | ICD-10-CM

## 2019-12-10 DIAGNOSIS — R002 Palpitations: Secondary | ICD-10-CM

## 2020-02-08 ENCOUNTER — Other Ambulatory Visit: Payer: Self-pay | Admitting: Family Medicine

## 2020-02-10 ENCOUNTER — Telehealth: Payer: Self-pay | Admitting: Family Medicine

## 2020-02-10 ENCOUNTER — Other Ambulatory Visit: Payer: Self-pay

## 2020-02-10 DIAGNOSIS — R002 Palpitations: Secondary | ICD-10-CM

## 2020-02-10 DIAGNOSIS — I493 Ventricular premature depolarization: Secondary | ICD-10-CM

## 2020-02-10 MED ORDER — METOPROLOL SUCCINATE ER 50 MG PO TB24: ORAL_TABLET | ORAL | 2 refills | Status: DC

## 2020-02-10 MED ORDER — LOSARTAN POTASSIUM 100 MG PO TABS
100.0000 mg | ORAL_TABLET | Freq: Every day | ORAL | 3 refills | Status: DC
Start: 2020-02-10 — End: 2021-02-08

## 2020-02-10 NOTE — Telephone Encounter (Signed)
Pt rx sent to pharmacy

## 2020-02-10 NOTE — Telephone Encounter (Signed)
Patient has made an appt for 02/29/2020 however he needs his blood pressure medicine.  CB# [phone]

## 2020-02-29 ENCOUNTER — Other Ambulatory Visit: Payer: Self-pay

## 2020-02-29 ENCOUNTER — Ambulatory Visit (INDEPENDENT_AMBULATORY_CARE_PROVIDER_SITE_OTHER): Payer: PPO | Admitting: Family Medicine

## 2020-02-29 VITALS — BP 144/86 | HR 68 | Temp 97.8°F | Wt 202.0 lb

## 2020-02-29 DIAGNOSIS — E1165 Type 2 diabetes mellitus with hyperglycemia: Secondary | ICD-10-CM | POA: Diagnosis not present

## 2020-02-29 DIAGNOSIS — E1122 Type 2 diabetes mellitus with diabetic chronic kidney disease: Secondary | ICD-10-CM

## 2020-02-29 DIAGNOSIS — B182 Chronic viral hepatitis C: Secondary | ICD-10-CM

## 2020-02-29 DIAGNOSIS — I10 Essential (primary) hypertension: Secondary | ICD-10-CM

## 2020-02-29 DIAGNOSIS — N182 Chronic kidney disease, stage 2 (mild): Secondary | ICD-10-CM | POA: Diagnosis not present

## 2020-02-29 DIAGNOSIS — Z23 Encounter for immunization: Secondary | ICD-10-CM | POA: Diagnosis not present

## 2020-02-29 DIAGNOSIS — Z125 Encounter for screening for malignant neoplasm of prostate: Secondary | ICD-10-CM

## 2020-02-29 DIAGNOSIS — IMO0002 Reserved for concepts with insufficient information to code with codable children: Secondary | ICD-10-CM

## 2020-02-29 DIAGNOSIS — E7800 Pure hypercholesterolemia, unspecified: Secondary | ICD-10-CM | POA: Diagnosis not present

## 2020-02-29 NOTE — Addendum Note (Signed)
Addended by: Patrcia Dolly on: 02/29/2020 08:43 AM   Modules accepted: Orders

## 2020-02-29 NOTE — Progress Notes (Signed)
Subjective:    Patient ID: James Harper, male    DOB: 05/12/1945, 74 y.o.   MRN: [id number]  HPI Patient is a very pleasant 74 year old Caucasian male here today for regular checkup.  He has a history of diabetes mellitus type 2 currently just managed with Metformin.  He reports that his fasting blood sugars in the morning can typically be in the 140s to 150s.  He seldom checks it later in the day but when he does he states that it comes down.  He is overdue for an A1c.  He does occasionally get some neuropathic pain in his feet first thing in the morning that he describes as pins-and-needles.  However at the present time it does not bother him.  He denies any chest pain shortness of breath or dyspnea on exertion.  Blood pressure here today is elevated at 144/86.  However he states he checks his blood pressure at home and is typically in the 409 systolic.  He denies any myalgias or right upper quadrant pain on a statin.  He is due for prostate cancer screening.  He has had his Covid shot.  He is due for a flu shot and he was willing to accept that today.  He is also overdue for prostate cancer screening.  Past Medical History:  Diagnosis Date  . GERD (gastroesophageal reflux disease)   . Hep C w/o coma, chronic ([place])   . Hypertension   . Prediabetes    Past Surgical History:  Procedure Laterality Date  . BACK SURGERY  1980   Current Outpatient Medications on File Prior to Visit  Medication Sig Dispense Refill  . aspirin 81 MG chewable tablet Chew 1 tablet (81 mg total) by mouth daily. (Patient not taking: Reported on 09/17/2019) 90 tablet 0  . atorvastatin (LIPITOR) 20 MG tablet TAKE ONE TABLET (20MG  TOTAL) BY MOUTH DAILY (Patient not taking: Reported on 09/17/2019) 90 tablet 1  . losartan (COZAAR) 100 MG tablet Take 1 tablet (100 mg total) by mouth daily. 90 tablet 3  . metFORMIN (GLUCOPHAGE) 500 MG tablet TAKE ONE TABLET (500MG  TOTAL) BY MOUTH TWO TIMES DAILY WITH A MEAL 90 tablet 1  .  metoprolol succinate (TOPROL-XL) 50 MG 24 hr tablet TAKE ONE TABLET (50MG  TOTAL) BY MOUTH DAILY 90 tablet 2   No current facility-administered medications on file prior to visit.   Allergies  Allergen Reactions  . Omnipaque [Iohexol] Nausea And Vomiting    Pt states he has severe vomiting with IV contrast, especially the older kind he had over 20 yrs ago.  He had it one other time, it was injected slower and he said he did better.  . Norvasc [Amlodipine Besylate] Swelling   Social History   Socioeconomic History  . Marital status: Married    Spouse name: Not on file  . Number of children: 2  . Years of education: Not on file  . Highest education level: Not on file  Occupational History  . Not on file  Tobacco Use  . Smoking status: Former Smoker    Packs/day: 0.25    Years: 15.00    Pack years: 3.75    Types: Cigarettes    Quit date: 08/19/1972    Years since quitting: 47.5  . Smokeless tobacco: Never Used  Vaping Use  . Vaping Use: Never used  Substance and Sexual Activity  . Alcohol use: Yes    Comment: occ  . Drug use: No  . Sexual activity: Not on  file  Other Topics Concern  . Not on file  Social History Narrative  . Not on file   Social Determinants of Health   Financial Resource Strain:   . Difficulty of Paying Living Expenses: Not on file  Food Insecurity:   . Worried About Charity fundraiser in the Last Year: Not on file  . Ran Out of Food in the Last Year: Not on file  Transportation Needs:   . Lack of Transportation (Medical): Not on file  . Lack of Transportation (Non-Medical): Not on file  Physical Activity:   . Days of Exercise per Week: Not on file  . Minutes of Exercise per Session: Not on file  Stress:   . Feeling of Stress : Not on file  Social Connections:   . Frequency of Communication with Friends and Family: Not on file  . Frequency of Social Gatherings with Friends and Family: Not on file  . Attends Religious Services: Not on file  .  Active Member of Clubs or Organizations: Not on file  . Attends Archivist Meetings: Not on file  . Marital Status: Not on file  Intimate Partner Violence:   . Fear of Current or Ex-Partner: Not on file  . Emotionally Abused: Not on file  . Physically Abused: Not on file  . Sexually Abused: Not on file    Review of Systems  All other systems reviewed and are negative.      Objective:   Physical Exam Vitals reviewed.  Constitutional:      General: He is not in acute distress.    Appearance: Normal appearance. He is not ill-appearing, toxic-appearing or diaphoretic.  Cardiovascular:     Rate and Rhythm: Normal rate and regular rhythm.     Pulses: Normal pulses.     Heart sounds: Normal heart sounds. No murmur heard.  No friction rub. No gallop.   Pulmonary:     Effort: Pulmonary effort is normal. No respiratory distress.     Breath sounds: Normal breath sounds. No stridor. No wheezing, rhonchi or rales.  Abdominal:     General: Abdomen is flat. Bowel sounds are normal. There is no distension.     Palpations: Abdomen is soft. There is no mass.     Tenderness: There is no abdominal tenderness. There is no guarding or rebound.     Hernia: No hernia is present.  Musculoskeletal:     Right lower leg: No edema.     Left lower leg: No edema.  Neurological:     Mental Status: He is alert.           Assessment & Plan:  Uncontrolled diabetes mellitus with stage 2 chronic kidney disease, without long-term current use of insulin ([place]) - Plan: Hemoglobin A1c, CBC with Differential/Platelet, COMPLETE METABOLIC PANEL WITH GFR, Lipid panel, Microalbumin, urine  Hep C w/o coma, chronic (HCC)  Prostate cancer screening - Plan: PSA  Patient's blood pressure today is slightly elevated however his home blood pressure sound much better controlled.  Therefore we will not make any additions to his blood pressure medication.  His diabetes sounds like it could possibly be better.   I will check an A1c and if greater than 7, I would recommend adding an SGLT2.  Also check a fasting lipid panel.  Ideally I like his LDL cholesterol less than 100.  Covid shot is up-to-date.  Patient received a flu shot today.  Diabetic foot exam was performed and is normal.

## 2020-03-01 LAB — CBC WITH DIFFERENTIAL/PLATELET
Absolute Monocytes: 661 cells/uL (ref 200–950)
Basophils Absolute: 52 cells/uL (ref 0–200)
Basophils Relative: 0.6 %
Eosinophils Absolute: 183 cells/uL (ref 15–500)
Eosinophils Relative: 2.1 %
HCT: 44.8 % (ref 38.5–50.0)
Hemoglobin: 15.1 g/dL (ref 13.2–17.1)
Lymphs Abs: 1662 cells/uL (ref 850–3900)
MCH: 29.8 pg (ref 27.0–33.0)
MCHC: 33.7 g/dL (ref 32.0–36.0)
MCV: 88.5 fL (ref 80.0–100.0)
MPV: 11.2 fL (ref 7.5–12.5)
Monocytes Relative: 7.6 %
Neutro Abs: 6142 cells/uL (ref 1500–7800)
Neutrophils Relative %: 70.6 %
Platelets: 277 10*3/uL (ref 140–400)
RBC: 5.06 10*6/uL (ref 4.20–5.80)
RDW: 13 % (ref 11.0–15.0)
Total Lymphocyte: 19.1 %
WBC: 8.7 10*3/uL (ref 3.8–10.8)

## 2020-03-01 LAB — HEMOGLOBIN A1C
Hgb A1c MFr Bld: 6 % of total Hgb — ABNORMAL HIGH (ref <5.7)
Mean Plasma Glucose: 126 mg/dL
eAG (mmol/L): 7 mmol/L

## 2020-03-01 LAB — COMPLETE METABOLIC PANEL WITH GFR
AG Ratio: 1.6 (calc) (ref 1.0–2.5)
ALT: 17 U/L (ref 9–46)
AST: 20 U/L (ref 10–35)
Albumin: 4.7 g/dL (ref 3.6–5.1)
Alkaline phosphatase (APISO): 74 U/L (ref 35–144)
BUN: 16 mg/dL (ref 7–25)
CO2: 28 mmol/L (ref 20–32)
Calcium: 10 mg/dL (ref 8.6–10.3)
Chloride: 101 mmol/L (ref 98–110)
Creat: 1 mg/dL (ref 0.70–1.18)
GFR, Est African American: 86 mL/min/{1.73_m2} (ref >=60)
GFR, Est Non African American: 74 mL/min/{1.73_m2} (ref >=60)
Globulin: 2.9 g/dL (calc) (ref 1.9–3.7)
Glucose, Bld: 133 mg/dL — ABNORMAL HIGH (ref 65–99)
Potassium: 5 mmol/L (ref 3.5–5.3)
Sodium: 138 mmol/L (ref 135–146)
Total Bilirubin: 0.7 mg/dL (ref 0.2–1.2)
Total Protein: 7.6 g/dL (ref 6.1–8.1)

## 2020-03-01 LAB — LIPID PANEL
Cholesterol: 159 mg/dL (ref <200)
HDL: 61 mg/dL (ref >=40)
LDL Cholesterol (Calc): 83 mg/dL (calc)
Non-HDL Cholesterol (Calc): 98 mg/dL (calc) (ref <130)
Total CHOL/HDL Ratio: 2.6 (calc) (ref <5.0)
Triglycerides: 69 mg/dL (ref <150)

## 2020-03-01 LAB — MICROALBUMIN, URINE: Microalb, Ur: 1.1 mg/dL

## 2020-03-01 LAB — PSA: PSA: 0.25 ng/mL (ref <=4.0)

## 2020-08-25 ENCOUNTER — Telehealth: Payer: Self-pay | Admitting: Family Medicine

## 2020-08-25 NOTE — Chronic Care Management (AMB) (Signed)
  Chronic Care Management   Note  08/25/2020 Name: James Harper MRN: [id number] DOB: 07-09-1945  James Harper is a 75 y.o. year old male who is a primary care patient of Susy Frizzle, MD. I reached out to Fritzi Mandes by phone today in response to a referral sent by [name]'s PCP, Susy Frizzle, MD.   [name] was given information about Chronic Care Management services today including:  1. CCM service includes personalized support from designated clinical staff supervised by his physician, including individualized plan of care and coordination with other care providers 2. 24/7 contact phone numbers for assistance for urgent and routine care needs. 3. Service will only be billed when office clinical staff spend 20 minutes or more in a month to coordinate care. 4. Only one practitioner may furnish and bill the service in a calendar month. 5. The patient may stop CCM services at any time (effective at the end of the month) by phone call to the office staff.   Patient agreed to services and verbal consent obtained.   Follow up plan:   Tatjana Secretary/administrator

## 2020-09-03 ENCOUNTER — Other Ambulatory Visit: Payer: Self-pay | Admitting: Cardiology

## 2020-09-03 DIAGNOSIS — R002 Palpitations: Secondary | ICD-10-CM

## 2020-09-03 DIAGNOSIS — I493 Ventricular premature depolarization: Secondary | ICD-10-CM

## 2020-09-08 ENCOUNTER — Other Ambulatory Visit: Payer: Self-pay

## 2020-09-08 ENCOUNTER — Ambulatory Visit (INDEPENDENT_AMBULATORY_CARE_PROVIDER_SITE_OTHER): Payer: PPO | Admitting: Family Medicine

## 2020-09-08 ENCOUNTER — Encounter: Payer: Self-pay | Admitting: Family Medicine

## 2020-09-08 VITALS — BP 144/70 | HR 76 | Temp 97.5°F | Resp 14 | Ht 69.0 in | Wt 196.0 lb

## 2020-09-08 DIAGNOSIS — E7800 Pure hypercholesterolemia, unspecified: Secondary | ICD-10-CM | POA: Diagnosis not present

## 2020-09-08 DIAGNOSIS — M25512 Pain in left shoulder: Secondary | ICD-10-CM

## 2020-09-08 DIAGNOSIS — E118 Type 2 diabetes mellitus with unspecified complications: Secondary | ICD-10-CM | POA: Diagnosis not present

## 2020-09-08 DIAGNOSIS — T466X5A Adverse effect of antihyperlipidemic and antiarteriosclerotic drugs, initial encounter: Secondary | ICD-10-CM

## 2020-09-08 DIAGNOSIS — G720 Drug-induced myopathy: Secondary | ICD-10-CM | POA: Diagnosis not present

## 2020-09-08 DIAGNOSIS — I10 Essential (primary) hypertension: Secondary | ICD-10-CM | POA: Diagnosis not present

## 2020-09-08 DIAGNOSIS — M25511 Pain in right shoulder: Secondary | ICD-10-CM

## 2020-09-08 DIAGNOSIS — G8929 Other chronic pain: Secondary | ICD-10-CM

## 2020-09-08 MED ORDER — MELOXICAM 15 MG PO TABS: 15.0000 mg | ORAL_TABLET | Freq: Every day | ORAL | 0 refills | Status: DC

## 2020-09-08 NOTE — Progress Notes (Signed)
Subjective:    Patient ID: James Harper, male    DOB: 01/02/1946, 75 y.o.   MRN: [id number]  HPI 12/21 Patient is a very pleasant 75 year old Caucasian male here today for regular checkup.  He has a history of diabetes mellitus type 2 currently just managed with Metformin.  He reports that his fasting blood sugars in the morning can typically be in the 140s to 150s.  He seldom checks it later in the day but when he does he states that it comes down.  He is overdue for an A1c.  He does occasionally get some neuropathic pain in his feet first thing in the morning that he describes as pins-and-needles.  However at the present time it does not bother him.  He denies any chest pain shortness of breath or dyspnea on exertion.  Blood pressure here today is elevated at 144/86.  However he states he checks his blood pressure at home and is typically in the 194 systolic.  He denies any myalgias or right upper quadrant pain on a statin.  He is due for prostate cancer screening.  He has had his Covid shot.  He is due for a flu shot and he was willing to accept that today.  He is also overdue for prostate cancer screening.  At that  time, my plan was:   Patient's blood pressure today is slightly elevated however his home blood pressure sound much better controlled.  Therefore we will not make any additions to his blood pressure medication.  His diabetes sounds like it could possibly be better.  I will check an A1c and if greater than 7, I would recommend adding an SGLT2.  Also check a fasting lipid panel.  Ideally I like his LDL cholesterol less than 100.  Covid shot is up-to-date.  Patient received a flu shot today.  Diabetic foot exam was performed and is normal.  09/08/20 Patient is not on a statin.  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  He is here today complaining of bilateral shoulder pain left greater than right.  He has palpable crepitus in his left shoulder.  He  also has decreased strength with resisted abduction.  Abduction greater than 90 degrees elicits pain.  He has a positive empty can sign and pain with Hawking sign.  He also complains of the shoulder aching and throbbing at night.  He denies any chest pain shortness of breath or dyspnea on exertion.  Blood pressure today is borderline at 144/70.  He is not checking his sugars regularly.  Past Medical History:  Diagnosis Date   GERD (gastroesophageal reflux disease)    Hep C w/o coma, chronic (HCC)    Hypertension    Prediabetes    Past Surgical History:  Procedure Laterality Date   BACK SURGERY  1980   Current Outpatient Medications on File Prior to Visit  Medication Sig Dispense Refill   aspirin 81 MG chewable tablet Chew 1 tablet (81 mg total) by mouth daily. (Patient not taking: Reported on 09/17/2019) 90 tablet 0   atorvastatin (LIPITOR) 20 MG tablet TAKE ONE TABLET (20MG  TOTAL) BY MOUTH DAILY (Patient not taking: Reported on 09/17/2019) 90 tablet 1   losartan (COZAAR) 100 MG tablet Take 1 tablet (100 mg total) by mouth daily. 90 tablet 3   metFORMIN (GLUCOPHAGE) 500 MG tablet TAKE ONE TABLET (500MG  TOTAL) BY MOUTH TWO TIMES DAILY WITH A MEAL 90 tablet 1   metoprolol  succinate (TOPROL-XL) 50 MG 24 hr tablet TAKE ONE TABLET (50MG  TOTAL) BY MOUTH DAILY 90 tablet 2   No current facility-administered medications on file prior to visit.   Allergies  Allergen Reactions   Omnipaque [Iohexol] Nausea And Vomiting    Pt states he has severe vomiting with IV contrast, especially the older kind he had over 20 yrs ago.  He had it one other time, it was injected slower and he said he did better.   Norvasc [Amlodipine Besylate] Swelling   Social History   Socioeconomic History   Marital status: Married    Spouse name: Not on file   Number of children: 2   Years of education: Not on file   Highest education level: Not on file  Occupational History   Not on file  Tobacco Use   Smoking status:  Former    Packs/day: 0.25    Years: 15.00    Pack years: 3.75    Types: Cigarettes    Quit date: 08/19/1972    Years since quitting: 48.0   Smokeless tobacco: Never  Vaping Use   Vaping Use: Never used  Substance and Sexual Activity   Alcohol use: Yes    Comment: occ   Drug use: No   Sexual activity: Not on file  Other Topics Concern   Not on file  Social History Narrative   Not on file   Social Determinants of Health   Financial Resource Strain: Not on file  Food Insecurity: Not on file  Transportation Needs: Not on file  Physical Activity: Not on file  Stress: Not on file  Social Connections: Not on file  Intimate Partner Violence: Not on file    Review of Systems     Objective:   Physical Exam Vitals reviewed.  Constitutional:      General: He is not in acute distress.    Appearance: Normal appearance. He is not ill-appearing, toxic-appearing or diaphoretic.  Cardiovascular:     Rate and Rhythm: Normal rate and regular rhythm.     Pulses: Normal pulses.     Heart sounds: Normal heart sounds. No murmur heard.   No friction rub. No gallop.  Pulmonary:     Effort: Pulmonary effort is normal. No respiratory distress.     Breath sounds: Normal breath sounds. No stridor. No wheezing, rhonchi or rales.  Abdominal:     General: Abdomen is flat. Bowel sounds are normal. There is no distension.     Palpations: Abdomen is soft. There is no mass.     Tenderness: There is no abdominal tenderness. There is no guarding or rebound.     Hernia: No hernia is present.  Musculoskeletal:     Left shoulder: Tenderness, bony tenderness and crepitus present. Decreased range of motion. Decreased strength.     Right lower leg: No edema.     Left lower leg: No edema.  Neurological:     Mental Status: He is alert.          Assessment & Plan:  Benign essential HTN  Pure hypercholesterolemia  Controlled diabetes mellitus type 2 with complications, unspecified whether long  term insulin use ([place]) - Plan: CBC with Differential/Platelet, COMPLETE METABOLIC PANEL WITH GFR, Lipid panel, Microalbumin, urine, Hemoglobin A1c  Chronic pain of both shoulders I suspect a combination of osteoarthritis as well as bursitis and perhaps underlying rotator cuff tendinitis.  We discussed a cortisone injection versus NSAIDs.  He would like to try NSAIDs first.  Begin meloxicam  15 mg a day and reassess in 2 to 3 weeks.  Check CBC, CMP, lipid panel, microalbumin, and an A1c.  Goal A1c is less than 6.5.  I like his LDL cholesterol to be less than 100.  He declines statin due to a history of statin induced myopathy.  If greater than 100 we could try Zetia.  Await the results of his renal function.  If renal function is stable, I would recommend adding hydrochlorothiazide at a low dose to try to lower his blood pressure

## 2020-09-09 LAB — CBC WITH DIFFERENTIAL/PLATELET
Absolute Monocytes: 570 cells/uL (ref 200–950)
Basophils Absolute: 74 cells/uL (ref 0–200)
Basophils Relative: 0.8 %
Eosinophils Absolute: 202 cells/uL (ref 15–500)
Eosinophils Relative: 2.2 %
HCT: 46.2 % (ref 38.5–50.0)
Hemoglobin: 15 g/dL (ref 13.2–17.1)
Lymphs Abs: 1546 cells/uL (ref 850–3900)
MCH: 28.5 pg (ref 27.0–33.0)
MCHC: 32.5 g/dL (ref 32.0–36.0)
MCV: 87.7 fL (ref 80.0–100.0)
MPV: 11.2 fL (ref 7.5–12.5)
Monocytes Relative: 6.2 %
Neutro Abs: 6808 cells/uL (ref 1500–7800)
Neutrophils Relative %: 74 %
Platelets: 252 10*3/uL (ref 140–400)
RBC: 5.27 10*6/uL (ref 4.20–5.80)
RDW: 13 % (ref 11.0–15.0)
Total Lymphocyte: 16.8 %
WBC: 9.2 10*3/uL (ref 3.8–10.8)

## 2020-09-09 LAB — COMPLETE METABOLIC PANEL WITH GFR
AG Ratio: 1.5 (calc) (ref 1.0–2.5)
ALT: 12 U/L (ref 9–46)
AST: 14 U/L (ref 10–35)
Albumin: 4.4 g/dL (ref 3.6–5.1)
Alkaline phosphatase (APISO): 70 U/L (ref 35–144)
BUN: 15 mg/dL (ref 7–25)
CO2: 29 mmol/L (ref 20–32)
Calcium: 10.2 mg/dL (ref 8.6–10.3)
Chloride: 102 mmol/L (ref 98–110)
Creat: 1 mg/dL (ref 0.70–1.18)
GFR, Est African American: 85 mL/min/{1.73_m2} (ref >=60)
GFR, Est Non African American: 73 mL/min/{1.73_m2} (ref >=60)
Globulin: 3 g/dL (calc) (ref 1.9–3.7)
Glucose, Bld: 122 mg/dL — ABNORMAL HIGH (ref 65–99)
Potassium: 5.1 mmol/L (ref 3.5–5.3)
Sodium: 139 mmol/L (ref 135–146)
Total Bilirubin: 0.8 mg/dL (ref 0.2–1.2)
Total Protein: 7.4 g/dL (ref 6.1–8.1)

## 2020-09-09 LAB — LIPID PANEL
Cholesterol: 136 mg/dL (ref <200)
HDL: 54 mg/dL (ref >=40)
LDL Cholesterol (Calc): 65 mg/dL (calc)
Non-HDL Cholesterol (Calc): 82 mg/dL (calc) (ref <130)
Total CHOL/HDL Ratio: 2.5 (calc) (ref <5.0)
Triglycerides: 87 mg/dL (ref <150)

## 2020-09-09 LAB — MICROALBUMIN, URINE: Microalb, Ur: 1.1 mg/dL

## 2020-09-09 LAB — HEMOGLOBIN A1C
Hgb A1c MFr Bld: 6.2 % of total Hgb — ABNORMAL HIGH (ref <5.7)
Mean Plasma Glucose: 131 mg/dL
eAG (mmol/L): 7.3 mmol/L

## 2020-09-12 ENCOUNTER — Telehealth: Payer: Self-pay | Admitting: Family Medicine

## 2020-09-12 ENCOUNTER — Telehealth: Payer: Self-pay

## 2020-09-12 NOTE — Telephone Encounter (Signed)
Patient received message on MyChart about provider adding a new medication to lower his BP. Patient unsure of name of medication. Advised patient to call pharmacy. Please advise at [phone].

## 2020-09-12 NOTE — Telephone Encounter (Signed)
Called pt to let him know no new med has been added as of yet. Per pcp note (copy/paste) "Await the results of his renal function.  If renal function is stable, I would recommend adding hydrochlorothiazide at a low dose to try to lower his blood pressure"

## 2020-09-13 ENCOUNTER — Other Ambulatory Visit: Payer: Self-pay

## 2020-09-13 DIAGNOSIS — I10 Essential (primary) hypertension: Secondary | ICD-10-CM

## 2020-09-13 MED ORDER — HYDROCHLOROTHIAZIDE 12.5 MG PO TABS: 12.5000 mg | ORAL_TABLET | Freq: Every day | ORAL | 3 refills | Status: DC

## 2020-09-13 NOTE — Telephone Encounter (Signed)
Sin medication for pt hctz 12.5mg  po daily

## 2020-10-04 ENCOUNTER — Telehealth: Payer: Self-pay | Admitting: Pharmacist

## 2020-10-04 NOTE — Progress Notes (Signed)
Chronic Care Management Pharmacy Note  10/06/2020 Name:  James Harper MRN:  [id number] DOB:  09/19/1945  Summary: Initial visit with PharmD for CCM.  Med list updated.  No longer taking meloxicam, burned his stomach.  BP since started HCTZ has been 333L to 456Y systolic.  He has not noticed much of a difference.  Recommendations/Changes made from today's visit: Continue to monitor BP - follow up plan started, consider increase to 53m daily if BP not at goal  Plan: BP check in 30 days FU with PharmD in 4 months   Subjective: James SOVINEis an 75y.o. year old male who is a primary patient of Pickard, WCammie Mcgee MD.  The CCM team was consulted for assistance with disease management and care coordination needs.    Engaged with patient face to face for initial visit in response to provider referral for pharmacy case management and/or care coordination services.   Consent to Services:  The patient was given the following information about Chronic Care Management services today, agreed to services, and gave verbal consent: 1. CCM service includes personalized support from designated clinical staff supervised by the primary care provider, including individualized plan of care and coordination with other care providers 2. 24/7 contact phone numbers for assistance for urgent and routine care needs. 3. Service will only be billed when office clinical staff spend 20 minutes or more in a month to coordinate care. 4. Only one practitioner may furnish and bill the service in a calendar month. 5.The patient may stop CCM services at any time (effective at the end of the month) by phone call to the office staff. 6. The patient will be responsible for cost sharing (co-pay) of up to 20% of the service fee (after annual deductible is met). Patient agreed to services and consent obtained.  Patient Care Team: PSusy Frizzle MD as PCP - General (Family Medicine) DEdythe Clarity [facility] Pharmacist  (Pharmacist)  Recent office visits:  09/08/20 Dr. PDennard Schaumann For follow-up. STARTED Meloxicam 15 mg daily. STOPPED Atorvastatin.   Recent consult visits:  None in the last six months   Hospital visits:  None in previous 6 months     Objective:  Lab Results  Component Value Date   CREATININE 1.00 09/08/2020   BUN 15 09/08/2020   GFRNONAA 73 09/08/2020   GFRAA 85 09/08/2020   NA 139 09/08/2020   K 5.1 09/08/2020   CALCIUM 10.2 09/08/2020   CO2 29 09/08/2020   GLUCOSE 122 (H) 09/08/2020    Lab Results  Component Value Date/Time   HGBA1C 6.2 (H) 09/08/2020 08:16 AM   HGBA1C 6.0 (H) 02/29/2020 08:21 AM   MICROALBUR 1.1 09/08/2020 08:16 AM   MICROALBUR 1.1 02/29/2020 08:21 AM    Last diabetic Eye exam: No results found for: HMDIABEYEEXA  Last diabetic Foot exam: No results found for: HMDIABFOOTEX   Lab Results  Component Value Date   CHOL 136 09/08/2020   HDL 54 09/08/2020   LDLCALC 65 09/08/2020   TRIG 87 09/08/2020   CHOLHDL 2.5 09/08/2020    Hepatic Function Latest Ref Rng & Units 09/08/2020 02/29/2020 01/15/2019  Total Protein 6.1 - 8.1 g/dL 7.4 7.6 7.8  Albumin 3.6 - 5.1 g/dL - - -  AST 10 - 35 U/L _0 ALT 9 - 46 U/L _1 Alk Phosphatase 40 - 115 U/L - - -  Total Bilirubin 0.2 - 1.2 mg/dL 0.8 0.7 0.9  No results found for: TSH, FREET4  CBC Latest Ref Rng & Units 09/08/2020 02/29/2020 01/15/2019  WBC 3.8 - 10.8 Thousand/uL 9.2 8.7 8.5  Hemoglobin 13.2 - 17.1 g/dL 15.0 15.1 15.7  Hematocrit 38.5 - 50.0 % 46.2 44.8 47.0  Platelets 140 - 400 Thousand/uL 252 277 248    No results found for: VD25OH  Clinical ASCVD: Yes  The 10-year ASCVD risk score Mikey Bussing DC Jr., et al., 2013) is: 49.2%   Values used to calculate the score:     Age: 11 years     Sex: Male     Is Non-Hispanic African American: No     Diabetic: Yes     Tobacco smoker: No     Systolic Blood Pressure: 161 mmHg     Is BP treated: Yes     HDL Cholesterol: 54 mg/dL     Total  Cholesterol: 136 mg/dL    Depression screen [facility] 2/9 09/08/2020 01/17/2017 01/03/2016  Decreased Interest 0 0 0  Down, Depressed, Hopeless 0 0 0  PHQ - 2 Score 0 0 0     Social History   Tobacco Use  Smoking Status Former   Packs/day: 0.25   Years: 15.00   Pack years: 3.75   Types: Cigarettes   Quit date: 08/19/1972   Years since quitting: 48.1  Smokeless Tobacco Never   BP Readings from Last 3 Encounters:  09/08/20 (!) 144/70  02/29/20 (!) 144/86  09/17/19 (!) 165/78   Pulse Readings from Last 3 Encounters:  09/08/20 76  02/29/20 68  09/17/19 64   Wt Readings from Last 3 Encounters:  09/08/20 196 lb (88.9 kg)  02/29/20 202 lb (91.6 kg)  09/17/19 206 lb 6.4 oz (93.6 kg)   BMI Readings from Last 3 Encounters:  09/08/20 28.94 kg/m  02/29/20 29.83 kg/m  09/17/19 30.48 kg/m    Assessment/Interventions: Review of patient past medical history, allergies, medications, health status, including review of consultants reports, laboratory and other test data, was performed as part of comprehensive evaluation and provision of chronic care management services.   SDOH:  (Social Determinants of Health) assessments and interventions performed: Yes  Financial Resource Strain: Low Risk    Difficulty of Paying Living Expenses: Not very hard    SDOH Screenings   Alcohol Screen: Low Risk    Last Alcohol Screening Score (AUDIT): 0  Depression (PHQ2-9): Low Risk    PHQ-2 Score: 0  Financial Resource Strain: Low Risk    Difficulty of Paying Living Expenses: Not very hard  Food Insecurity: Not on file  Housing: Not on file  Physical Activity: Not on file  Social Connections: Not on file  Stress: Not on file  Tobacco Use: Medium Risk   Smoking Tobacco Use: Former   Smokeless Tobacco Use: Never  Transportation Needs: Not on file    CCM Care Plan  Allergies  Allergen Reactions   Omnipaque [Iohexol] Nausea And Vomiting    Pt states he has severe vomiting with IV contrast,  especially the older kind he had over 20 yrs ago.  He had it one other time, it was injected slower and he said he did better.   Norvasc [Amlodipine Besylate] Swelling    Medications Reviewed Today     Reviewed by Edythe Clarity, [facility] (Pharmacist) on 10/06/20 at [phone]  Med List Status: <None>   Medication Order Taking? Sig Documenting Provider Last Dose Status Informant  aspirin 81 MG chewable tablet [id number] Yes Chew 1 tablet (81 mg total)  by mouth daily. Susy Frizzle, MD Taking Active   hydrochlorothiazide (HYDRODIURIL) 12.5 MG tablet [id number] Yes Take 1 tablet (12.5 mg total) by mouth daily. Susy Frizzle, MD Taking Active   losartan (COZAAR) 100 MG tablet [id number] Yes Take 1 tablet (100 mg total) by mouth daily. Susy Frizzle, MD Taking Active   meloxicam [facility]) 15 MG tablet [id number] Yes Take 1 tablet (15 mg total) by mouth daily. Susy Frizzle, MD Taking Active   metFORMIN (GLUCOPHAGE) 500 MG tablet [id number] Yes TAKE ONE TABLET (500MG TOTAL) BY MOUTH TWO TIMES DAILY WITH A MEAL Susy Frizzle, MD Taking Active   metoprolol succinate (TOPROL-XL) 50 MG 24 hr tablet [id number] Yes TAKE ONE TABLET (50MG TOTAL) BY MOUTH DAILY Adrian Prows, MD Taking Active             Patient Active Problem List   Diagnosis Date Noted   Arthritis of right wrist 11/19/2019   Right wrist pain 11/19/2019   Trigger finger of all digits of right hand 11/19/2019   Frequent PVCs 09/17/2019   Hep C w/o coma, chronic ([place])    Prediabetes     Immunization History  Administered Date(s) Administered   Fluad Quad(high Dose 65+) 01/15/2019, 02/29/2020   Influenza,inj,Quad PF,6+ Mos 01/17/2017   Moderna Sars-Covid-2 Vaccination 11/05/2019, 12/03/2019   Pneumococcal Polysaccharide-23 01/17/2017    Conditions to be addressed/monitored:  PVCs, Pre-DM, Arthritis, HTN  Care Plan : General Pharmacy (Adult)  Updates made by Edythe Clarity, RPH since 10/06/2020 12:00 AM      Problem: PVCs, Pre-DM, Arthritis, HTN   Priority: High  Onset Date: 10/06/2020     Long-Range Goal: Patient-Specific Goal   Start Date: 10/06/2020  Expected End Date: 04/08/2021  This Visit's Progress: On track  Priority: High  Note:   Current Barriers:  Unable to achieve control of blood pressure   Pharmacist Clinical Goal(s):  Patient will achieve adherence to monitoring guidelines and medication adherence to achieve therapeutic efficacy achieve control of BP as evidenced by home monitoring adhere to plan to optimize therapeutic regimen for HTN as evidenced by report of adherence to recommended medication management changes contact provider office for questions/concerns as evidenced notation of same in electronic health record through collaboration with PharmD and provider.   Interventions: 1:1 collaboration with Susy Frizzle, MD regarding development and update of comprehensive plan of care as evidenced by provider attestation and co-signature Inter-disciplinary care team collaboration (see longitudinal plan of care) Comprehensive medication review performed; medication list updated in electronic medical record  Hypertension (BP goal <140/90) -Not ideally controlled -Current treatment: Losartan 168m daily HCTZ 12.547mdaily Metoprolol XL 10050maily -Medications previously tried: none noted  -Current home readings: 130s-140s/70-80s -Current dietary habits: cheerios for breakfast with banana and blueberries, sandwich for lunch -Current exercise habits: house work, no specific exercise plan -Reports hypotensive/hypertensive symptoms including dizziness -Educated on BP goals and benefits of medications for prevention of heart attack, stroke and kidney damage; Daily salt intake goal < 2300 mg; Exercise goal of 150 minutes per week; Importance of home blood pressure monitoring; Proper BP monitoring technique; Symptoms of hypotension and importance of maintaining adequate  hydration; -Counseled to monitor BP at home 2-3 times per week, document, and provide log at future appointments -Recommended to continue current medication Recommended increase home monitoring Contact provider if BP consistently > 140/90 - f/u plan initiated  Hyperlipidemia: (LDL goal < 100) -Controlled -Current treatment: None -Medications previously tried: statins  -Current dietary patterns:  see DM -Current exercise habits: minimal -Educated on Cholesterol goals;  Importance of limiting foods high in cholesterol; Exercise goal of 150 minutes per week; Most recent LDL was excellent -Recommended continue current management strategies  Pre-Diabetes (A1c goal <7%) -Controlled -Current medications: Metformin 52m BID with meals -Medications previously tried: none noted  -Current home glucose readings fasting glucose: 130-150s post prandial glucose: 160s -Denies hypoglycemic/hyperglycemic symptoms -Current meal patterns:  breakfast: cheerios with blueberries and banana  lunch: sandwich  dinner:  snacks: ice cream sandwiches (low sugar) at night drinks:  -Current exercise: none - just typical housework -Educated on A1c and blood sugar goals; Complications of diabetes including kidney damage, retinal damage, and cardiovascular disease; Prevention and management of hypoglycemic episodes; -Counseled to check feet daily and get yearly eye exams -Recommended to continue current medication  Osteoarthritis (Goal: Minimize symptoms) -Not ideally controlled -Current treatment  none -Medications previously tried: Meloxicam (burned stomach)  -no longer taking meloxicam since it burned his stomach, not interested in cortisone injections at this time -Recommended Tylenol, contact providers with worsening symptoms  Patient Goals/Self-Care Activities Patient will:  - take medications as prescribed focus on medication adherence by pill counts check glucose daily, document, and  provide at future appointments check blood pressure a few times per week, document, and provide at future appointments  Follow Up Plan: The care management team will reach out to the patient again over the next 120 days.        Medication Assistance: None required.  Patient affirms current coverage meets needs.  Compliance/Adherence/Medication fill history: Care Gaps: Colonoscopy PNA vaccine  Star-Rating Drugs: Metformin 5050m06/11/22 90ds HCTZ 12.2m40m6/21/22 90ds Losartan 100m21mily 08/08/20 90ds  Patient[facility]:  [id number]: [phone]: [phone] pill box? No - has own system Pt endorses 100% compliance  We discussed: Benefits of medication synchronization, packaging and delivery as well as enhanced pharmacist oversight with [facility]. Patient decided to: Continue current medication management strategy  Care Plan and Follow Up Patient Decision:  Patient agrees to Care Plan and Follow-up.  Plan: The care management team will reach out to the patient again over the next 120 days.  ChriBeverly MilcharmD Clinical Pharmacist [phone]

## 2020-10-04 NOTE — Progress Notes (Addendum)
    Chronic Care Management Pharmacy Assistant   Name: James Harper  MRN: [id number] DOB: 08-Dec-1945  James Harper is an 74 y.o. year old male who presents for his initial CCM visit with the clinical pharmacist.  Reason for Encounter: Chart Prep    Conditions to be addressed/monitored: HTN,Prediabetes, Arthritis.   Primary concerns for visit include: HTN.  Recent office visits:  09/08/20 Dr. Dennard Schaumann. For follow-up. STARTED Meloxicam 15 mg daily. STOPPED Atorvastatin.   Recent consult visits:  None in the last six months  Hospital visits:  None in previous 6 months  Medications: Outpatient Encounter Medications as of 10/04/2020  Medication Sig   aspirin 81 MG chewable tablet Chew 1 tablet (81 mg total) by mouth daily.   hydrochlorothiazide (HYDRODIURIL) 12.5 MG tablet Take 1 tablet (12.5 mg total) by mouth daily.   losartan (COZAAR) 100 MG tablet Take 1 tablet (100 mg total) by mouth daily.   meloxicam (MOBIC) 15 MG tablet Take 1 tablet (15 mg total) by mouth daily.   metFORMIN (GLUCOPHAGE) 500 MG tablet TAKE ONE TABLET (500MG  TOTAL) BY MOUTH TWO TIMES DAILY WITH A MEAL   metoprolol succinate (TOPROL-XL) 50 MG 24 hr tablet TAKE ONE TABLET (50MG  TOTAL) BY MOUTH DAILY   No facility-administered encounter medications on file as of 10/04/2020.   Have you seen any other providers since your last visit? Patient stated no.  Any changes in your medications or health? Patient stated no.  Any side effects from any medications? Patient stated no.  Do you have an symptoms or problems not managed by your medications? Patient stated no.  Any concerns about your health right now? Patient stated no.  Has your provider asked that you check blood pressure, blood sugar, or follow special diet at home? Patient stated he monitors his blood pressure and his blood sugar.   Do you get any type of exercise on a regular basis? Patient stated he works around the house.   Can you think of a goal  you would like to reach for your health? Patient stated he would like to feel better.   Do you have any problems getting your medications? Patient stated no.  Is there anything that you would like to discuss during the appointment? Patient stated no.  Please bring medications and supplements to appointment, patient reminded of his face to face appointment on 10/06/20 at 9 am.   [place], [place] Pharmacist Assistant [phone]

## 2020-10-06 ENCOUNTER — Other Ambulatory Visit: Payer: Self-pay

## 2020-10-06 ENCOUNTER — Ambulatory Visit (INDEPENDENT_AMBULATORY_CARE_PROVIDER_SITE_OTHER): Payer: PPO | Admitting: Pharmacist

## 2020-10-06 ENCOUNTER — Ambulatory Visit: Payer: Self-pay

## 2020-10-06 DIAGNOSIS — I10 Essential (primary) hypertension: Secondary | ICD-10-CM

## 2020-10-06 DIAGNOSIS — I493 Ventricular premature depolarization: Secondary | ICD-10-CM

## 2020-10-06 DIAGNOSIS — R7303 Prediabetes: Secondary | ICD-10-CM

## 2020-10-06 NOTE — Patient Instructions (Signed)
Visit Information   Goals Addressed             This Visit's Progress    Track and Manage My Blood Pressure-Hypertension       Timeframe:  Long-Range Goal Priority:  High Start Date:  10/06/20                           Expected End Date: 04/08/20                       Follow Up Date 12/23/20    - check blood pressure 3 times per week - choose a place to take my blood pressure (home, clinic or office, retail store) - write blood pressure results in a log or diary    Why is this important?   You won't feel high blood pressure, but it can still hurt your blood vessels.  High blood pressure can cause heart or kidney problems. It can also cause a stroke.  Making lifestyle changes like losing a little weight or eating less salt will help.  Checking your blood pressure at home and at different times of the day can help to control blood pressure.  If the doctor prescribes medicine remember to take it the way the doctor ordered.  Call the office if you cannot afford the medicine or if there are questions about it.     Notes:        Patient Care Plan: General Pharmacy (Adult)     Problem Identified: PVCs, Pre-DM, Arthritis, HTN   Priority: High  Onset Date: 10/06/2020     Long-Range Goal: Patient-Specific Goal   Start Date: 10/06/2020  Expected End Date: 04/08/2021  This Visit's Progress: On track  Priority: High  Note:   Current Barriers:  Unable to achieve control of blood pressure   Pharmacist Clinical Goal(s):  Patient will achieve adherence to monitoring guidelines and medication adherence to achieve therapeutic efficacy achieve control of BP as evidenced by home monitoring adhere to plan to optimize therapeutic regimen for HTN as evidenced by report of adherence to recommended medication management changes contact provider office for questions/concerns as evidenced notation of same in electronic health record through collaboration with PharmD and provider.    Interventions: 1:1 collaboration with Susy Frizzle, MD regarding development and update of comprehensive plan of care as evidenced by provider attestation and co-signature Inter-disciplinary care team collaboration (see longitudinal plan of care) Comprehensive medication review performed; medication list updated in electronic medical record  Hypertension (BP goal <140/90) -Not ideally controlled -Current treatment: Losartan 100mg  daily HCTZ 12.5mg  daily Metoprolol XL 100mg  daily -Medications previously tried: none noted  -Current home readings: 130s-140s/70-80s -Current dietary habits: cheerios for breakfast with banana and blueberries, sandwich for lunch -Current exercise habits: house work, no specific exercise plan -Reports hypotensive/hypertensive symptoms including dizziness -Educated on BP goals and benefits of medications for prevention of heart attack, stroke and kidney damage; Daily salt intake goal < 2300 mg; Exercise goal of 150 minutes per week; Importance of home blood pressure monitoring; Proper BP monitoring technique; Symptoms of hypotension and importance of maintaining adequate hydration; -Counseled to monitor BP at home 2-3 times per week, document, and provide log at future appointments -Recommended to continue current medication Recommended increase home monitoring Contact provider if BP consistently > 140/90 - f/u plan initiated  Hyperlipidemia: (LDL goal < 100) -Controlled -Current treatment: None -Medications previously tried: statins  -Current dietary patterns:  see DM -Current exercise habits: minimal -Educated on Cholesterol goals;  Importance of limiting foods high in cholesterol; Exercise goal of 150 minutes per week; Most recent LDL was excellent -Recommended continue current management strategies  Pre-Diabetes (A1c goal <7%) -Controlled -Current medications: Metformin 500mg  BID with meals -Medications previously tried: none noted   -Current home glucose readings fasting glucose: 130-150s post prandial glucose: 160s -Denies hypoglycemic/hyperglycemic symptoms -Current meal patterns:  breakfast: cheerios with blueberries and banana  lunch: sandwich  dinner:  snacks: ice cream sandwiches (low sugar) at night drinks:  -Current exercise: none - just typical housework -Educated on A1c and blood sugar goals; Complications of diabetes including kidney damage, retinal damage, and cardiovascular disease; Prevention and management of hypoglycemic episodes; -Counseled to check feet daily and get yearly eye exams -Recommended to continue current medication  Osteoarthritis (Goal: Minimize symptoms) -Not ideally controlled -Current treatment  none -Medications previously tried: Meloxicam (burned stomach)  -no longer taking meloxicam since it burned his stomach, not interested in cortisone injections at this time -Recommended Tylenol, contact providers with worsening symptoms  Patient Goals/Self-Care Activities Patient will:  - take medications as prescribed focus on medication adherence by pill counts check glucose daily, document, and provide at future appointments check blood pressure a few times per week, document, and provide at future appointments  Follow Up Plan: The care management team will reach out to the patient again over the next 120 days.       James Harper was given information about Chronic Care Management services today including:  CCM service includes personalized support from designated clinical staff supervised by his physician, including individualized plan of care and coordination with other care providers 24/7 contact phone numbers for assistance for urgent and routine care needs. Standard insurance, coinsurance, copays and deductibles apply for chronic care management only during months in which we provide at least 20 minutes of these services. Most insurances cover these services at 100%,  however patients may be responsible for any copay, coinsurance and/or deductible if applicable. This service may help you avoid the need for more expensive face-to-face services. Only one practitioner may furnish and bill the service in a calendar month. The patient may stop CCM services at any time (effective at the end of the month) by phone call to the office staff.  Patient agreed to services and verbal consent obtained.   Patient verbalizes understanding of instructions provided today and agrees to view in [place].  Telephone follow up appointment with pharmacy team member scheduled for: 4 months  Edythe Clarity, [place]

## 2020-11-02 ENCOUNTER — Telehealth: Payer: Self-pay | Admitting: Pharmacist

## 2020-11-02 NOTE — Progress Notes (Addendum)
Chronic Care Management Pharmacy Assistant   Name: James Harper  MRN: [id number] DOB: Mar 03, 1946  Reason for Encounter: Disease State for HTN.   Conditions to be addressed/monitored: PVCs, Pre-DM, Arthritis, HTN  Recent office visits:  None since 10/06/20  Recent consult visits:  None since 10/06/20  Hospital visits:  None since 10/06/20  Medications: Outpatient Encounter Medications as of 11/02/2020  Medication Sig   aspirin 81 MG chewable tablet Chew 1 tablet (81 mg total) by mouth daily.   hydrochlorothiazide (HYDRODIURIL) 12.5 MG tablet Take 1 tablet (12.5 mg total) by mouth daily.   losartan (COZAAR) 100 MG tablet Take 1 tablet (100 mg total) by mouth daily.   meloxicam (MOBIC) 15 MG tablet Take 1 tablet (15 mg total) by mouth daily.   metFORMIN (GLUCOPHAGE) 500 MG tablet TAKE ONE TABLET ('500MG'$  TOTAL) BY MOUTH TWO TIMES DAILY WITH A MEAL   metoprolol succinate (TOPROL-XL) 50 MG 24 hr tablet TAKE ONE TABLET ('50MG'$  TOTAL) BY MOUTH DAILY   No facility-administered encounter medications on file as of 11/02/2020.  Reviewed chart prior to disease state call. Spoke with patient regarding BP  Recent Office Vitals: BP Readings from Last 3 Encounters:  09/08/20 (!) 144/70  02/29/20 (!) 144/86  09/17/19 (!) 165/78   Pulse Readings from Last 3 Encounters:  09/08/20 76  02/29/20 68  09/17/19 64    Wt Readings from Last 3 Encounters:  09/08/20 196 lb (88.9 kg)  02/29/20 202 lb (91.6 kg)  09/17/19 206 lb 6.4 oz (93.6 kg)     Kidney Function Lab Results  Component Value Date/Time   CREATININE 1.00 09/08/2020 08:16 AM   CREATININE 1.00 02/29/2020 08:21 AM   GFRNONAA 73 09/08/2020 08:16 AM   GFRAA 85 09/08/2020 08:16 AM    BMP Latest Ref Rng & Units 09/08/2020 02/29/2020 01/15/2019  Glucose 65 - 99 mg/dL 122(H) 133(H) 148(H)  BUN 7 - 25 mg/dL '15 16 22  '$ Creatinine 0.70 - 1.18 mg/dL 1.00 1.00 1.08  BUN/Creat Ratio 6 - 22 (calc) NOT APPLICABLE NOT APPLICABLE NOT APPLICABLE   Sodium A999333 - 146 mmol/L 139 138 138  Potassium 3.5 - 5.3 mmol/L 5.1 5.0 4.2  Chloride 98 - 110 mmol/L 102 101 100  CO2 20 - 32 mmol/L '29 28 25  '$ Calcium 8.6 - 10.3 mg/dL 10.2 10.0 10.4(H)    Current antihypertensive regimen:  Losartan '100mg'$  daily HCTZ 12.'5mg'$  daily Metoprolol XL '100mg'$  daily  How often are you checking your Blood Pressure? Patient stated daily  Current home BP readings: Patient stated his blood pressure ranges around 130's/60-70s.  What recent interventions/DTPs have been made by any provider to improve Blood Pressure control since last CPP Visit: None.   Any recent hospitalizations or ED visits since last visit with CPP? Patient stated no.  What diet changes have been made to improve Blood Pressure Control?  Patient stated he chooses healthy meals. He stated he only drinks diet drinks.  What exercise is being done to improve your Blood Pressure Control?  Patient stated he is outside in the yard a lot. He stated he tries to stay busy at least a half a day everyday.   Adherence Review: Is the patient currently on ACE/ARB medication? Losartan 100 mg  Does the patient have >5 day gap between last estimated fill dates? Per mis rpts, no.  Star Rating Drugs: Losartan 100 mg 90 DS 08/08/20, Metformin 500 mg 90 DS 09/03/20.   Follow-Up:Pharmacist Review   Charlann Lange, RMA Clinical  Pharmacist Assistant [phone]

## 2020-12-11 ENCOUNTER — Ambulatory Visit (INDEPENDENT_AMBULATORY_CARE_PROVIDER_SITE_OTHER): Payer: PPO | Admitting: Family Medicine

## 2020-12-11 DIAGNOSIS — Z1211 Encounter for screening for malignant neoplasm of colon: Secondary | ICD-10-CM

## 2020-12-11 DIAGNOSIS — Z0000 Encounter for general adult medical examination without abnormal findings: Secondary | ICD-10-CM

## 2020-12-11 NOTE — Progress Notes (Signed)
Subjective:  I connected with  James Harper on 12/11/20 by telemedicine application and verified that I am speaking with the correct person using two identifiers.   I discussed the limitations of evaluation and management by telemedicine. The patient expressed understanding and agreed to proceed. Location of patient: Home  Location of provider: Office  Persons participating in James Harper and James Harper is a 75 y.o. male who presents for an Initial Medicare Annual Wellness Visit.  Review of Systems    Defer to PCP       Objective:    There were no vitals filed for this visit. There is no height or weight on file to calculate BMI.  Advanced Directives 12/11/2020  Does Patient Have a Medical Advance Directive? No  Would patient like information on creating a medical advance directive? No - Patient declined    Current Medications (verified) Outpatient Encounter Medications as of 12/11/2020  Medication Sig   aspirin 81 MG chewable tablet Chew 1 tablet (81 mg total) by mouth daily.   hydrochlorothiazide (HYDRODIURIL) 12.5 MG tablet Take 1 tablet (12.5 mg total) by mouth daily.   losartan (COZAAR) 100 MG tablet Take 1 tablet (100 mg total) by mouth daily.   metFORMIN (GLUCOPHAGE) 500 MG tablet TAKE ONE TABLET ('500MG'$  TOTAL) BY MOUTH TWO TIMES DAILY WITH A MEAL   metoprolol succinate (TOPROL-XL) 50 MG 24 hr tablet TAKE ONE TABLET ('50MG'$  TOTAL) BY MOUTH DAILY   meloxicam (MOBIC) 15 MG tablet Take 1 tablet (15 mg total) by mouth daily. (Patient not taking: Reported on 12/11/2020)   No facility-administered encounter medications on file as of 12/11/2020.    Allergies (verified) Omnipaque [iohexol] and Norvasc [amlodipine besylate]   History: Past Medical History:  Diagnosis Date   GERD (gastroesophageal reflux disease)    Hep C w/o coma, chronic (HCC)    Hypertension    Prediabetes    Past Surgical History:  Procedure Laterality Date    BACK SURGERY  1980   Family History  Problem Relation Age of Onset   Heart attack Mother    Heart disease Mother    Heart disease Sister    Heart disease Brother    Diabetes Brother    Diabetes Sister    Social History   Socioeconomic History   Marital status: Married    Spouse name: Not on file   Number of children: 2   Years of education: Not on file   Highest education level: Not on file  Occupational History   Not on file  Tobacco Use   Smoking status: Former    Packs/day: 0.25    Years: 15.00    Pack years: 3.75    Types: Cigarettes    Quit date: 08/19/1972    Years since quitting: 48.3   Smokeless tobacco: Never  Vaping Use   Vaping Use: Never used  Substance and Sexual Activity   Alcohol use: Yes    Comment: occ   Drug use: Never   Sexual activity: Not on file  Other Topics Concern   Not on file  Social History Narrative   Not on file   Social Determinants of Health   Financial Resource Strain: Low Risk    Difficulty of Paying Living Expenses: Not very hard  Food Insecurity: Not on file  Transportation Needs: Not on file  Physical Activity: Not on file  Stress: Not on file  Social Connections: Not on file    Tobacco  Counseling Counseling given: Not Answered   Clinical Intake:  Pre-visit preparation completed: Yes  Pain : No/denies pain     Diabetes: Yes CBG done?: Yes CBG resulted in Enter/ Edit results?: No Did pt. bring in CBG monitor from home?: No  How often do you need to have someone help you when you read instructions, pamphlets, or other written materials from your doctor or pharmacy?: 1 - Never What is the last grade level you completed in school?: 10th  Diabetic?Yes  Interpreter Needed?: No  Information entered by :: Caroleena Paolini,CMA   Activities of Daily Living In your present state of health, do you have any difficulty performing the following activities: 12/11/2020  Hearing? Y  Comment Right ear only  Vision? N   Comment Wear glasses  Difficulty concentrating or making decisions? N  Walking or climbing stairs? N  Dressing or bathing? N  Doing errands, shopping? N  Some recent data might be hidden    Patient Care Team: Susy Frizzle, MD as PCP - General (Family Medicine) Edythe Clarity, [facility] as Pharmacist (Pharmacist)  Indicate any recent Medical Services you may have received from other than [facility] providers in the past year (date may be approximate).     Assessment:   This is a routine wellness examination for James Harper.  Hearing/Vision screen Hearing Screening - Comments:: Have trouble hearing out the right ear. Vision Screening - Comments:: Wear glasses  Dietary issues and exercise activities discussed:     Goals Addressed   None   Depression Screen PHQ 2/9 Scores 12/11/2020 09/08/2020 01/17/2017 01/03/2016 07/31/2013  PHQ - 2 Score 0 0 0 0 0    Fall Risk Fall Risk  12/11/2020 09/08/2020 10/22/2018 01/17/2017 07/31/2013  Falls in the past year? 0 0 0 No No  Comment - - Emmi Telephone Survey: data to providers prior to load - -  Number falls in past yr: 0 0 - - -  Injury with Fall? 0 0 - - -  Risk for fall due to : No Fall Risks No Fall Risks - - -  Follow up Education provided;Falls prevention discussed;Falls evaluation completed Falls evaluation completed - - -    FALL RISK PREVENTION PERTAINING TO THE HOME:  Any stairs in or around the home? Yes  If so, are there any without handrails? Yes  Home free of loose throw rugs in walkways, pet beds, electrical cords, etc? Yes  Adequate lighting in your home to reduce risk of falls? Yes   ASSISTIVE DEVICES UTILIZED TO PREVENT FALLS:  Life alert? No  Use of a cane, walker or w/c? No  Grab bars in the bathroom? No  Shower chair or bench in shower? No  Elevated toilet seat or a handicapped toilet? No   TIMED UP AND GO:  Was the test performed? No .  Length of time to ambulate 10 feet: N/A sec.   Gait steady and fast without  use of assistive device  Cognitive Function:     6CIT Screen 12/11/2020  What Year? 0 points  What month? 0 points  What time? 0 points  Count back from 20 0 points  Months in reverse 0 points  Repeat phrase 0 points  Total Score 0    Immunizations Immunization History  Administered Date(s) Administered   Fluad Quad(high Dose 65+) 01/15/2019, 02/29/2020   Influenza,inj,Quad PF,6+ Mos 01/17/2017   Moderna Sars-Covid-2 Vaccination 11/05/2019, 12/03/2019   Pneumococcal Polysaccharide-23 01/17/2017    TDAP status: Due, Education has been  provided regarding the importance of this vaccine. Advised may receive this vaccine at local pharmacy or Health Dept. Aware to provide a copy of the vaccination record if obtained from local pharmacy or Health Dept. Verbalized acceptance and understanding.  Flu Vaccine status: Due, Education has been provided regarding the importance of this vaccine. Advised may receive this vaccine at local pharmacy or Health Dept. Aware to provide a copy of the vaccination record if obtained from local pharmacy or Health Dept. Verbalized acceptance and understanding.  Pneumococcal vaccine status: Up to date  Covid-19 vaccine status: Completed vaccines  Qualifies for Shingles Vaccine? Yes   Zostavax completed No   Shingrix Completed?: No.    Education has been provided regarding the importance of this vaccine. Patient has been advised to call insurance company to determine out of pocket expense if they have not yet received this vaccine. Advised may also receive vaccine at local pharmacy or Health Dept. Verbalized acceptance and understanding.  Screening Tests Health Maintenance  Topic Date Due   COLONOSCOPY (Pts 45-53yr Insurance coverage will need to be confirmed)  Never done   TETANUS/TDAP  01/23/2021 (Originally 03/29/1964)   COVID-19 Vaccine (3 - Booster for Moderna series) 01/23/2021 (Originally 05/04/2020)   Zoster Vaccines- Shingrix (1 of 2) 03/12/2021  (Originally 03/30/1995)   INFLUENZA VACCINE  06/23/2021 (Originally 10/24/2020)   Hepatitis C Screening  Completed   HPV VACCINES  Aged Out    Health Maintenance  Health Maintenance Due  Topic Date Due   COLONOSCOPY (Pts 45-452yrInsurance coverage will need to be confirmed)  Never done    Colorectal cancer screening: [facility] GI [facility]. Pt aware the office will call re: appt.  Lung Cancer Screening: (Low Dose CT Chest recommended if Age 972-80ears, 30 pack-year currently smoking OR have quit w/in 15years.) does not qualify.   Lung Cancer Screening Referral: No  Additional Screening:  Hepatitis C Screening: does qualify; Completed 02/29/2020  Vision Screening: Recommended annual ophthalmology exams for early detection of glaucoma and other disorders of the eye. Is the patient up to date with their annual eye exam?  No  Who is the provider or what is the name of the office in which the patient attends annual eye exams? [facility] If pt is not established with a provider, would they like to be referred to a provider to establish care? No .   Dental Screening: Recommended annual dental exams for proper oral hygiene  Community Resource Referral / Chronic Care Management: CRR required this visit?  No   CCM required this visit?  No      Plan:     I have personally reviewed and noted the following in the patient's chart:   Medical and social history Use of alcohol, tobacco or illicit drugs  Current medications and supplements including opioid prescriptions. Patient is not currently taking opioid prescriptions. Functional ability and status Nutritional status Physical activity Advanced directives List of other physicians Hospitalizations, surgeries, and ER visits in previous 12 months Vitals Screenings to include cognitive, depression, and falls Referrals and appointments  In addition, I have reviewed and discussed with  patient certain preventive protocols, quality metrics, and best practice recommendations. A written personalized care plan for preventive services as well as general preventive health recommendations were provided to patient.     [place] 12/11/2020   Nurse Notes: Non- Face to Face  25 minutes visit.  Mr. RoFoth Thank you for taking  time to come for your Medicare Wellness Visit. I appreciate your ongoing commitment to your health goals. Please review the following plan we discussed and let me know if I can assist you in the future.   These are the goals we discussed:  Goals      Track and Manage My Blood Pressure-Hypertension     Timeframe:  Long-Range Goal Priority:  High Start Date:  10/06/20                           Expected End Date: 04/08/20                       Follow Up Date 12/23/20    - check blood pressure 3 times per week - choose a place to take my blood pressure (home, clinic or office, retail store) - write blood pressure results in a log or diary    Why is this important?   You won't feel high blood pressure, but it can still hurt your blood vessels.  High blood pressure can cause heart or kidney problems. It can also cause a stroke.  Making lifestyle changes like losing a little weight or eating less salt will help.  Checking your blood pressure at home and at different times of the day can help to control blood pressure.  If the doctor prescribes medicine remember to take it the way the doctor ordered.  Call the office if you cannot afford the medicine or if there are questions about it.     Notes:         This is a list of the screening recommended for you and due dates:  Health Maintenance  Topic Date Due   Colon Cancer Screening  Never done   Tetanus Vaccine  01/23/2021*   COVID-19 Vaccine (3 - Booster for Moderna series) 01/23/2021*   Zoster (Shingles) Vaccine (1 of 2) 03/12/2021*   Flu Shot  06/23/2021*   Hepatitis C Screening:  USPSTF Recommendation to screen - Ages 18-79 yo.  Completed   HPV Vaccine  Aged Out  *Topic was postponed. The date shown is not the original due date.

## 2020-12-11 NOTE — Patient Instructions (Signed)
Health Maintenance, Male Adopting a healthy lifestyle and getting preventive care are important in promoting health and wellness. Ask your health care provider about: The right schedule for you to have regular tests and exams. Things you can do on your own to prevent diseases and keep yourself healthy. What should I know about diet, weight, and exercise? Eat a healthy diet  Eat a diet that includes plenty of vegetables, fruits, low-fat dairy products, and lean protein. Do not eat a lot of foods that are high in solid fats, added sugars, or sodium. Maintain a healthy weight Body mass index (BMI) is a measurement that can be used to identify possible weight problems. It estimates body fat based on height and weight. Your health care provider can help determine your BMI and help you achieve or maintain a healthy weight. Get regular exercise Get regular exercise. This is one of the most important things you can do for your health. Most adults should: Exercise for at least 150 minutes each week. The exercise should increase your heart rate and make you sweat (moderate-intensity exercise). Do strengthening exercises at least twice a week. This is in addition to the moderate-intensity exercise. Spend less time sitting. Even light physical activity can be beneficial. Watch cholesterol and blood lipids Have your blood tested for lipids and cholesterol at 75 years of age, then have this test every 5 years. You may need to have your cholesterol levels checked more often if: Your lipid or cholesterol levels are high. You are older than 75 years of age. You are at high risk for heart disease. What should I know about cancer screening? Many types of cancers can be detected early and may often be prevented. Depending on your health history and family history, you may need to have cancer screening at various ages. This may include screening for: Colorectal cancer. Prostate cancer. Skin cancer. Lung  cancer. What should I know about heart disease, diabetes, and high blood pressure? Blood pressure and heart disease High blood pressure causes heart disease and increases the risk of stroke. This is more likely to develop in people who have high blood pressure readings, are of African descent, or are overweight. Talk with your health care provider about your target blood pressure readings. Have your blood pressure checked: Every 3-5 years if you are 18-39 years of age. Every year if you are 40 years old or older. If you are between the ages of 65 and 75 and are a current or former smoker, ask your health care provider if you should have a one-time screening for abdominal aortic aneurysm (AAA). Diabetes Have regular diabetes screenings. This checks your fasting blood sugar level. Have the screening done: Once every three years after age 45 if you are at a normal weight and have a low risk for diabetes. More often and at a younger age if you are overweight or have a high risk for diabetes. What should I know about preventing infection? Hepatitis B If you have a higher risk for hepatitis B, you should be screened for this virus. Talk with your health care provider to find out if you are at risk for hepatitis B infection. Hepatitis C Blood testing is recommended for: Everyone born from 1945 through 1965. Anyone with known risk factors for hepatitis C. Sexually transmitted infections (STIs) You should be screened each year for STIs, including gonorrhea and chlamydia, if: You are sexually active and are younger than 75 years of age. You are older than 75 years   of age and your health care provider tells you that you are at risk for this type of infection. Your sexual activity has changed since you were last screened, and you are at increased risk for chlamydia or gonorrhea. Ask your health care provider if you are at risk. Ask your health care provider about whether you are at high risk for HIV.  Your health care provider may recommend a prescription medicine to help prevent HIV infection. If you choose to take medicine to prevent HIV, you should first get tested for HIV. You should then be tested every 3 months for as long as you are taking the medicine. Follow these instructions at home: Lifestyle Do not use any products that contain nicotine or tobacco, such as cigarettes, e-cigarettes, and chewing tobacco. If you need help quitting, ask your health care provider. Do not use street drugs. Do not share needles. Ask your health care provider for help if you need support or information about quitting drugs. Alcohol use Do not drink alcohol if your health care provider tells you not to drink. If you drink alcohol: Limit how much you have to 0-2 drinks a day. Be aware of how much alcohol is in your drink. In the [place] one drink equals one 12 oz bottle of beer (355 mL), one 5 oz glass of wine (148 mL), or one 1 oz glass of hard liquor (44 mL). General instructions Schedule regular health, dental, and eye exams. Stay current with your vaccines. Tell your health care provider if: You often feel depressed. You have ever been abused or do not feel safe at home. Summary Adopting a healthy lifestyle and getting preventive care are important in promoting health and wellness. Follow your health care provider's instructions about healthy diet, exercising, and getting tested or screened for diseases. Follow your health care provider's instructions on monitoring your cholesterol and blood pressure. This information is not intended to replace advice given to you by your health care provider. Make sure you discuss any questions you have with your health care provider. Document Revised: 05/20/2020 Document Reviewed: 03/05/2018 Elsevier Patient Education  2022 Elsevier Inc.  

## 2020-12-13 ENCOUNTER — Encounter: Payer: Self-pay | Admitting: Internal Medicine

## 2020-12-19 ENCOUNTER — Other Ambulatory Visit: Payer: Self-pay | Admitting: Family Medicine

## 2021-01-04 ENCOUNTER — Telehealth: Payer: Self-pay | Admitting: Pharmacist

## 2021-01-04 NOTE — Progress Notes (Signed)
Chronic Care Management Pharmacy Assistant   Name: James Harper  MRN: [id number] DOB: 12/02/1945  Reason for Encounter: Disease State For HTN.    Conditions to be addressed/monitored: PVCs, Pre-DM, Arthritis, HTN  Recent office visits:  12/11/20 Dr. Dennard Schaumann For medicare annual wellness exam. No medication changes.  Recent consult visits:  None since 11/02/20  Hospital visits:  None since 11/02/20  Medications: Outpatient Encounter Medications as of 01/04/2021  Medication Sig   aspirin 81 MG chewable tablet Chew 1 tablet (81 mg total) by mouth daily.   hydrochlorothiazide (HYDRODIURIL) 12.5 MG tablet Take 1 tablet (12.5 mg total) by mouth daily.   losartan (COZAAR) 100 MG tablet Take 1 tablet (100 mg total) by mouth daily.   meloxicam (MOBIC) 15 MG tablet Take 1 tablet (15 mg total) by mouth daily. (Patient not taking: Reported on 12/11/2020)   metFORMIN (GLUCOPHAGE) 500 MG tablet TAKE ONE TABLET (500MG  TOTAL) BY MOUTH TWO TIMES DAILY WITH A MEAL   metoprolol succinate (TOPROL-XL) 50 MG 24 hr tablet TAKE ONE TABLET (50MG  TOTAL) BY MOUTH DAILY   No facility-administered encounter medications on file as of 01/04/2021.   Reviewed chart prior to disease state call. Spoke with patient regarding BP  Recent Office Vitals: BP Readings from Last 3 Encounters:  09/08/20 (!) 144/70  02/29/20 (!) 144/86  09/17/19 (!) 165/78   Pulse Readings from Last 3 Encounters:  09/08/20 76  02/29/20 68  09/17/19 64    Wt Readings from Last 3 Encounters:  09/08/20 196 lb (88.9 kg)  02/29/20 202 lb (91.6 kg)  09/17/19 206 lb 6.4 oz (93.6 kg)     Kidney Function Lab Results  Component Value Date/Time   CREATININE 1.00 09/08/2020 08:16 AM   CREATININE 1.00 02/29/2020 08:21 AM   GFRNONAA 73 09/08/2020 08:16 AM   GFRAA 85 09/08/2020 08:16 AM    BMP Latest Ref Rng & Units 09/08/2020 02/29/2020 01/15/2019  Glucose 65 - 99 mg/dL 122(H) 133(H) 148(H)  BUN 7 - 25 mg/dL 15 16 22   Creatinine  0.70 - 1.18 mg/dL 1.00 1.00 1.08  BUN/Creat Ratio 6 - 22 (calc) NOT APPLICABLE NOT APPLICABLE NOT APPLICABLE  Sodium 329 - 146 mmol/L 139 138 138  Potassium 3.5 - 5.3 mmol/L 5.1 5.0 4.2  Chloride 98 - 110 mmol/L 102 101 100  CO2 20 - 32 mmol/L 29 28 25   Calcium 8.6 - 10.3 mg/dL 10.2 10.0 10.4(H)    Current antihypertensive regimen:  Losartan 100mg  daily HCTZ 12.5mg  daily Metoprolol XL 100mg  daily  How often are you checking your Blood Pressure? Patient stated daily  Current home BP readings: 01/03/21-130/72  What recent interventions/DTPs have been made by any provider to improve Blood Pressure control since last CPP Visit: None.   Any recent hospitalizations or ED visits since last visit with CPP? Patient stated no.   What diet changes have been made to improve Blood Pressure Control?  Patient stated he still drinks diet drinks and eats a well rounded diet.  What exercise is being done to improve your Blood Pressure Control?  Patient stated even though its getting cold outside he is still outside daily staying active.   Adherence Review: Is the patient currently on ACE/ARB medication? Losartan 100 mg  Does the patient have >5 day gap between last estimated fill dates? Per misc rpts, no.  Care Gaps:Patient is due for his colonoscopy.   Star Rating Drugs:Metformin 500 mg 12/20/20 90 DS, Losartan 100 mg 11/08/20 90 DS.  Follow-Up:Pharmacist Review  Charlann Lange, [place] Pharmacist Assistant [phone]

## 2021-01-14 IMAGING — DX DG CHEST 2V
2 series · 2 of 2 positions shown · non-contrast
Comparison: January 19, 2005.

CLINICAL DATA: Shortness of breath, chest pain.

EXAM:
CHEST - 2 VIEW

[chest pa]
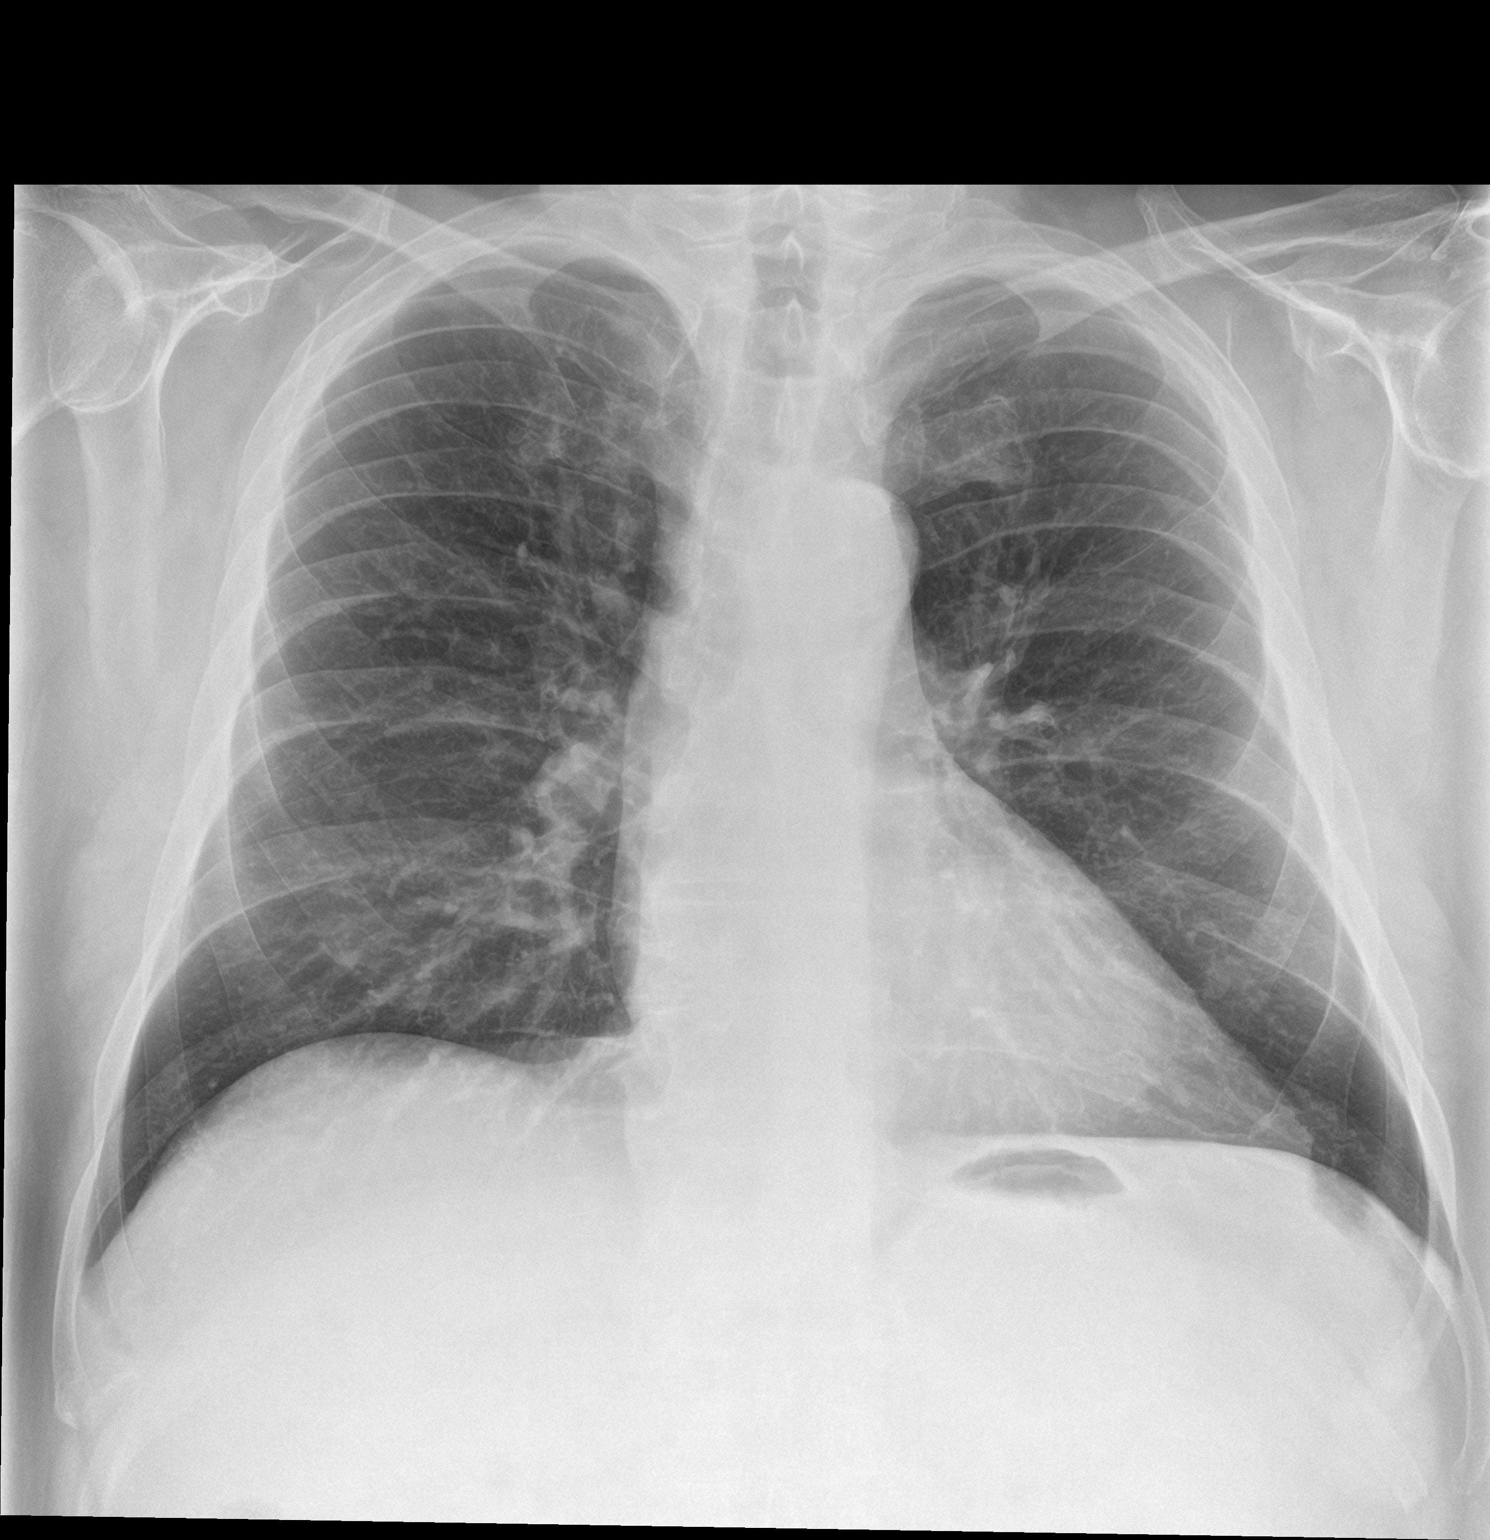

[chest lat]
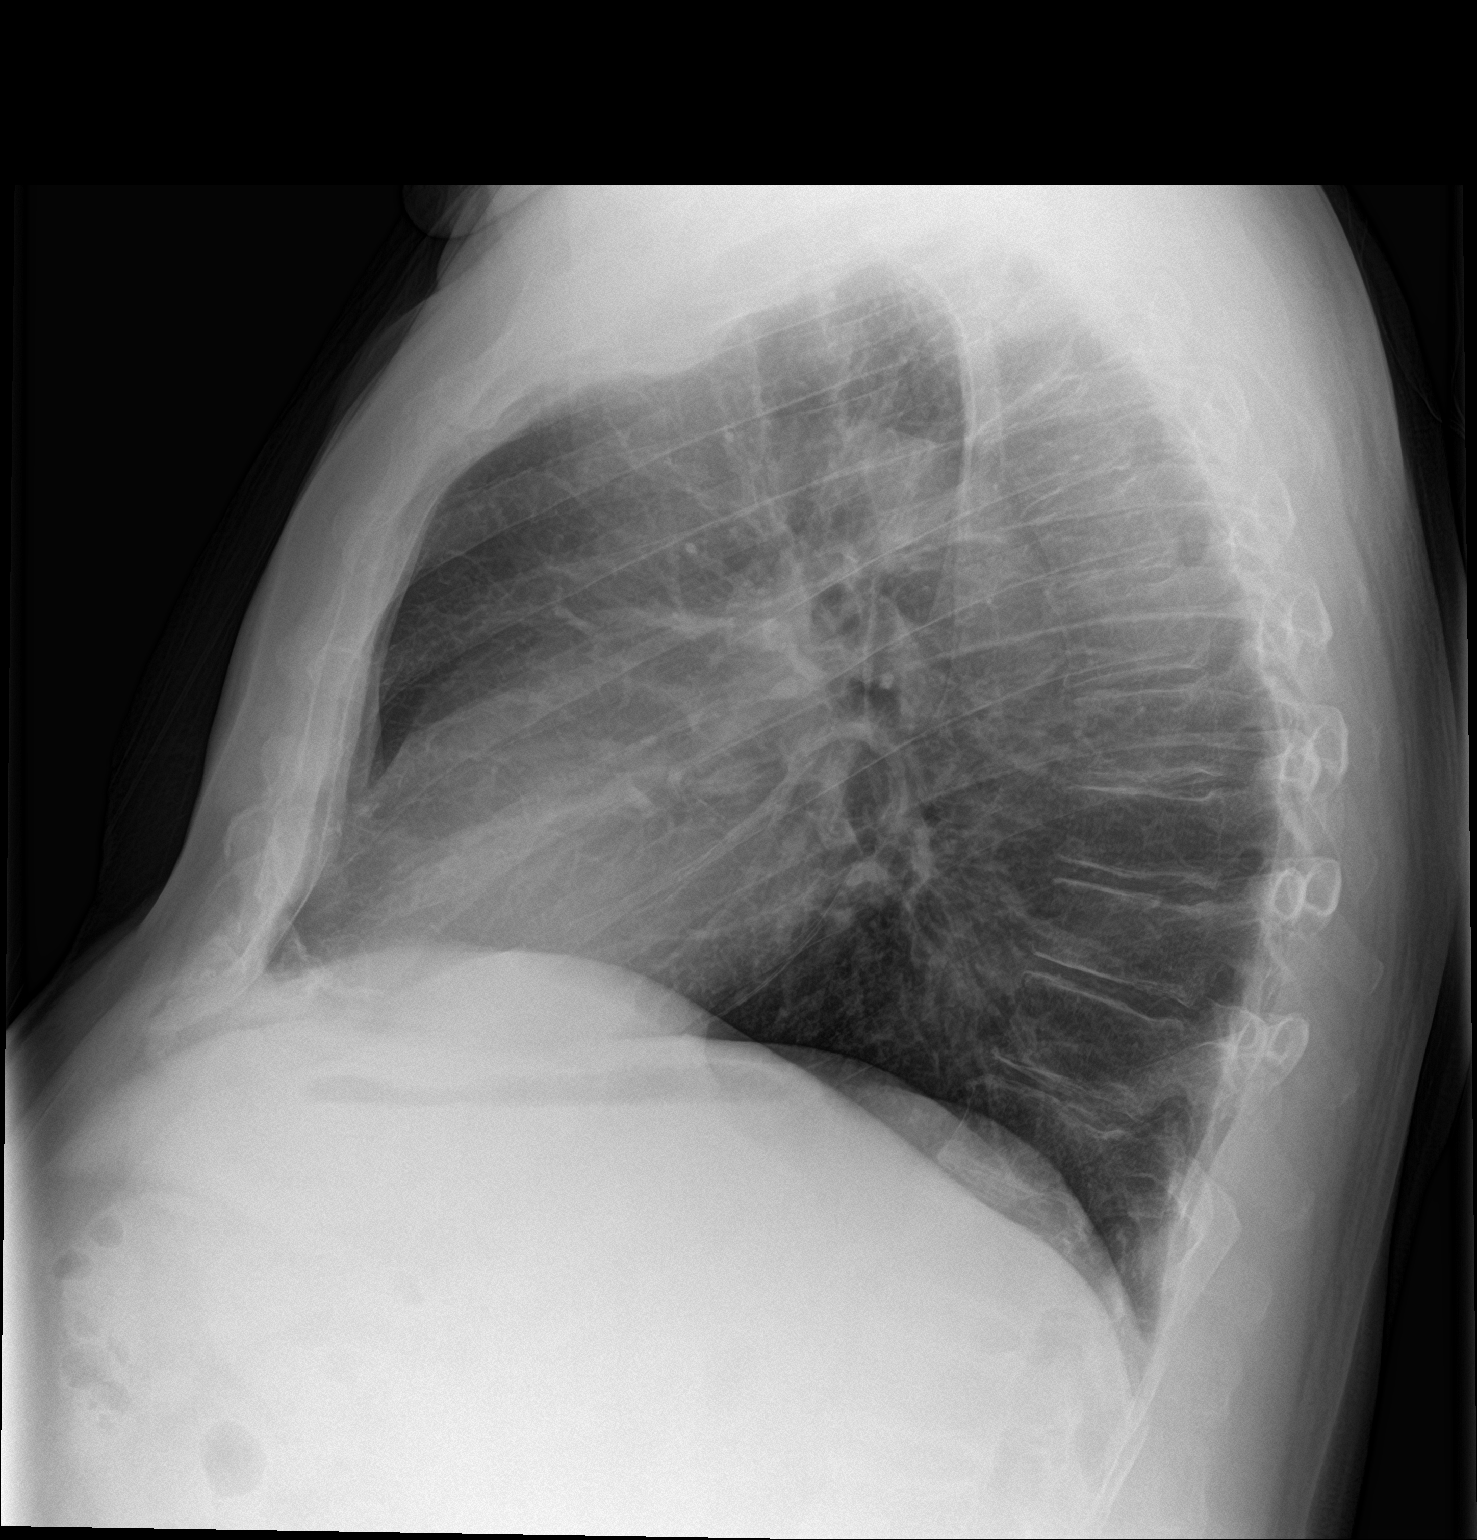

[2 of 2 positions shown; findings below may reference images not displayed]

FINDINGS: The heart size and mediastinal contours are within normal limits.
Both lungs are clear. No pneumothorax or pleural effusion is noted.
The visualized skeletal structures are unremarkable.
IMPRESSION: No active cardiopulmonary disease.

## 2021-02-02 NOTE — Progress Notes (Deleted)
Chronic Care Management Pharmacy Note  02/02/2021 Name:  James Harper MRN:  [id number] DOB:  1945/08/25  Summary: Initial visit with PharmD for CCM.  Med list updated.  No longer taking meloxicam, burned his stomach.  BP since started HCTZ has been 390Z to 009Q systolic.  He has not noticed much of a difference.  Recommendations/Changes made from today's visit: Continue to monitor BP - follow up plan started, consider increase to 26m daily if BP not at goal  Plan: BP check in 30 days FU with PharmD in 4 months   Subjective: James Harper an 75y.o. year old male who is a primary patient of Pickard, WCammie Mcgee MD.  The CCM team was consulted for assistance with disease management and care coordination needs.    Engaged with patient face to face for initial visit in response to provider referral for pharmacy case management and/or care coordination services.   Consent to Services:  The patient was given the following information about Chronic Care Management services today, agreed to services, and gave verbal consent: 1. CCM service includes personalized support from designated clinical staff supervised by the primary care provider, including individualized plan of care and coordination with other care providers 2. 24/7 contact phone numbers for assistance for urgent and routine care needs. 3. Service will only be billed when office clinical staff spend 20 minutes or more in a month to coordinate care. 4. Only one practitioner may furnish and bill the service in a calendar month. 5.The patient may stop CCM services at any time (effective at the end of the month) by phone call to the office staff. 6. The patient will be responsible for cost sharing (co-pay) of up to 20% of the service fee (after annual deductible is met). Patient agreed to services and consent obtained.  Patient Care Team: PSusy Frizzle MD as PCP - General (Family Medicine) DEdythe Clarity [facility] Pharmacist  (Pharmacist)  Recent office visits:  12/11/20 Dr. PDennard SchaumannFor medicare annual wellness exam. No medication changes.     Recent consult visits:  None in the last six months   Hospital visits:  None in previous 6 months     Objective:  Lab Results  Component Value Date   CREATININE 1.00 09/08/2020   BUN 15 09/08/2020   GFRNONAA 73 09/08/2020   GFRAA 85 09/08/2020   NA 139 09/08/2020   K 5.1 09/08/2020   CALCIUM 10.2 09/08/2020   CO2 29 09/08/2020   GLUCOSE 122 (H) 09/08/2020    Lab Results  Component Value Date/Time   HGBA1C 6.2 (H) 09/08/2020 08:16 AM   HGBA1C 6.0 (H) 02/29/2020 08:21 AM   MICROALBUR 1.1 09/08/2020 08:16 AM   MICROALBUR 1.1 02/29/2020 08:21 AM    Last diabetic Eye exam: No results found for: HMDIABEYEEXA  Last diabetic Foot exam: No results found for: HMDIABFOOTEX   Lab Results  Component Value Date   CHOL 136 09/08/2020   HDL 54 09/08/2020   LDLCALC 65 09/08/2020   TRIG 87 09/08/2020   CHOLHDL 2.5 09/08/2020    Hepatic Function Latest Ref Rng & Units 09/08/2020 02/29/2020 01/15/2019  Total Protein 6.1 - 8.1 g/dL 7.4 7.6 7.8  Albumin 3.6 - 5.1 g/dL - - -  AST 10 - 35 U/L 14 20 16   ALT 9 - 46 U/L 12 17 16   Alk Phosphatase 40 - 115 U/L - - -  Total Bilirubin 0.2 - 1.2 mg/dL 0.8 0.7 0.9  No results found for: TSH, FREET4  CBC Latest Ref Rng & Units 09/08/2020 02/29/2020 01/15/2019  WBC 3.8 - 10.8 Thousand/uL 9.2 8.7 8.5  Hemoglobin 13.2 - 17.1 g/dL 15.0 15.1 15.7  Hematocrit 38.5 - 50.0 % 46.2 44.8 47.0  Platelets 140 - 400 Thousand/uL 252 277 248    No results found for: VD25OH  Clinical ASCVD: Yes  The 10-year ASCVD risk score (Arnett DK, et al., 2019) is: 52.2%   Values used to calculate the score:     Age: 75 years     Sex: Male     Is Non-Hispanic African American: No     Diabetic: Yes     Tobacco smoker: Yes     Systolic Blood Pressure: 825 mmHg     Is BP treated: Yes     HDL Cholesterol: 54 mg/dL     Total Cholesterol:  136 mg/dL    Depression screen [facility] 2/9 12/11/2020 09/08/2020 01/17/2017  Decreased Interest 0 0 0  Down, Depressed, Hopeless 0 0 0  PHQ - 2 Score 0 0 0     Social History   Tobacco Use  Smoking Status Former   Packs/day: 0.25   Years: 15.00   Pack years: 3.75   Types: Cigarettes   Quit date: 08/19/1972   Years since quitting: 48.4  Smokeless Tobacco Never   BP Readings from Last 3 Encounters:  09/08/20 (!) 144/70  02/29/20 (!) 144/86  09/17/19 (!) 165/78   Pulse Readings from Last 3 Encounters:  09/08/20 76  02/29/20 68  09/17/19 64   Wt Readings from Last 3 Encounters:  09/08/20 196 lb (88.9 kg)  02/29/20 202 lb (91.6 kg)  09/17/19 206 lb 6.4 oz (93.6 kg)   BMI Readings from Last 3 Encounters:  09/08/20 28.94 kg/m  02/29/20 29.83 kg/m  09/17/19 30.48 kg/m    Assessment/Interventions: Review of patient past medical history, allergies, medications, health status, including review of consultants reports, laboratory and other test data, was performed as part of comprehensive evaluation and provision of chronic care management services.   SDOH:  (Social Determinants of Health) assessments and interventions performed: Yes  Financial Resource Strain: Low Risk    Difficulty of Paying Living Expenses: Not very hard    SDOH Screenings   Alcohol Screen: Low Risk    Last Alcohol Screening Score (AUDIT): 0  Depression (PHQ2-9): Low Risk    PHQ-2 Score: 0  Financial Resource Strain: Low Risk    Difficulty of Paying Living Expenses: Not very hard  Food Insecurity: Not on file  Housing: Not on file  Physical Activity: Not on file  Social Connections: Not on file  Stress: Not on file  Tobacco Use: Medium Risk   Smoking Tobacco Use: Former   Smokeless Tobacco Use: Never   Passive Exposure: Not on file  Transportation Needs: Not on file    CCM Care Plan  Allergies  Allergen Reactions   Omnipaque [Iohexol] Nausea And Vomiting    Pt states he has severe vomiting  with IV contrast, especially the older kind he had over 20 yrs ago.  He had it one other time, it was injected slower and he said he did better.   Norvasc [Amlodipine Besylate] Swelling    Medications Reviewed Today     Reviewed by Dan Maker, CMA (Certified Medical Assistant) on 12/11/20 at 0900  Med List Status: <None>   Medication Order Taking? Sig Documenting Provider Last Dose Status Informant  aspirin 81 MG chewable tablet  [id number] Yes Chew 1 tablet (81 mg total) by mouth daily. Susy Frizzle, MD Taking Active   hydrochlorothiazide (HYDRODIURIL) 12.5 MG tablet [id number] Yes Take 1 tablet (12.5 mg total) by mouth daily. Susy Frizzle, MD Taking Active   losartan (COZAAR) 100 MG tablet [id number] Yes Take 1 tablet (100 mg total) by mouth daily. Susy Frizzle, MD Taking Active   meloxicam [facility]) 15 MG tablet [id number] No Take 1 tablet (15 mg total) by mouth daily.  Patient not taking: Reported on 12/11/2020   Susy Frizzle, MD Not Taking Active   metFORMIN (GLUCOPHAGE) 500 MG tablet [id number] Yes TAKE ONE TABLET (500MG TOTAL) BY MOUTH TWO TIMES DAILY WITH A MEAL Susy Frizzle, MD Taking Active   metoprolol succinate (TOPROL-XL) 50 MG 24 hr tablet [id number] Yes TAKE ONE TABLET (50MG TOTAL) BY MOUTH DAILY Adrian Prows, MD Taking Active             Patient Active Problem List   Diagnosis Date Noted   Arthritis of right wrist 11/19/2019   Right wrist pain 11/19/2019   Trigger finger of all digits of right hand 11/19/2019   Frequent PVCs 09/17/2019   Hep C w/o coma, chronic ([place])    Prediabetes     Immunization History  Administered Date(s) Administered   Fluad Quad(high Dose 65+) 01/15/2019, 02/29/2020   Influenza,inj,Quad PF,6+ Mos 01/17/2017   Moderna Sars-Covid-2 Vaccination 11/05/2019, 12/03/2019   Pneumococcal Polysaccharide-23 01/17/2017    Conditions to be addressed/monitored:  PVCs, Pre-DM, Arthritis, HTN  There are no care plans that you  recently modified to display for this patient.    Medication Assistance: None required.  Patient affirms current coverage meets needs.  Compliance/Adherence/Medication fill history: Care Gaps: Colonoscopy PNA vaccine  Star-Rating Drugs: Metformin 558m 09/03/20 90ds HCTZ 12.535m06/21/22 90ds Losartan 10030maily 08/08/20 90ds  Patient[facility]:  [facility] [address] [id number]: [phone]: [phone] pill box? No - has own system Pt endorses 100% compliance  We discussed: Benefits of medication synchronization, packaging and delivery as well as enhanced pharmacist oversight with [employer]. Patient decided to: Continue current medication management strategy  Care Plan and Follow Up Patient Decision:  Patient agrees to Care Plan and Follow-up.  Plan: The care management team will reach out to the patient again over the next 120 days.  ChrBeverly MilchharmD Clinical Pharmacist [facility] [phone]  Current Barriers:  Unable to achieve control of blood pressure   Pharmacist Clinical Goal(s):  Patient will achieve adherence to monitoring guidelines and medication adherence to achieve therapeutic efficacy achieve control of BP as evidenced by home monitoring adhere to plan to optimize therapeutic regimen for HTN as evidenced by report of adherence to recommended medication management changes contact provider office for questions/concerns as evidenced notation of same in electronic health record through collaboration with PharmD and provider.   Interventions: 1:1 collaboration with PicSusy FrizzleD regarding development and update of comprehensive plan of care as evidenced by provider attestation and co-signature Inter-disciplinary care team collaboration (see longitudinal plan of care) Comprehensive medication review performed; medication list  updated in electronic medical record  Hypertension (BP goal <140/90) -Not ideally controlled -Current treatment: Losartan 100m82mily HCTZ 12.5mg 25mly Metoprolol XL 100mg 68my -Medications previously tried: none noted  -Current home readings: 130s-140s/70-80s -Current dietary habits: cheerios for breakfast with banana and blueberries, sandwich  for lunch -Current exercise habits: house work, no specific exercise plan -Reports hypotensive/hypertensive symptoms including dizziness -Educated on BP goals and benefits of medications for prevention of heart attack, stroke and kidney damage; Daily salt intake goal < 2300 mg; Exercise goal of 150 minutes per week; Importance of home blood pressure monitoring; Proper BP monitoring technique; Symptoms of hypotension and importance of maintaining adequate hydration; -Counseled to monitor BP at home 2-3 times per week, document, and provide log at future appointments -Recommended to continue current medication Recommended increase home monitoring Contact provider if BP consistently > 140/90 - f/u plan initiated  Hyperlipidemia: (LDL goal < 100) -Controlled -Current treatment: None -Medications previously tried: statins  -Current dietary patterns: see DM -Current exercise habits: minimal -Educated on Cholesterol goals;  Importance of limiting foods high in cholesterol; Exercise goal of 150 minutes per week; Most recent LDL was excellent -Recommended continue current management strategies  Pre-Diabetes (A1c goal <7%) -Controlled -Current medications: Metformin 581m BID with meals -Medications previously tried: none noted  -Current home glucose readings fasting glucose: 130-150s post prandial glucose: 160s -Denies hypoglycemic/hyperglycemic symptoms -Current meal patterns:  breakfast: cheerios with blueberries and banana  lunch: sandwich  dinner:  snacks: ice cream sandwiches (low sugar) at night drinks:  -Current exercise:  none - just typical housework -Educated on A1c and blood sugar goals; Complications of diabetes including kidney damage, retinal damage, and cardiovascular disease; Prevention and management of hypoglycemic episodes; -Counseled to check feet daily and get yearly eye exams -Recommended to continue current medication  Osteoarthritis (Goal: Minimize symptoms) -Not ideally controlled -Current treatment  none -Medications previously tried: Meloxicam (burned stomach)  -no longer taking meloxicam since it burned his stomach, not interested in cortisone injections at this time -Recommended Tylenol, contact providers with worsening symptoms  Patient Goals/Self-Care Activities Patient will:  - take medications as prescribed focus on medication adherence by pill counts check glucose daily, document, and provide at future appointments check blood pressure a few times per week, document, and provide at future appointments  Follow Up Plan: The care management team will reach out to the patient again over the next 120 days.

## 2021-02-08 ENCOUNTER — Other Ambulatory Visit: Payer: Self-pay | Admitting: Family Medicine

## 2021-02-09 ENCOUNTER — Telehealth: Payer: Self-pay

## 2021-02-23 ENCOUNTER — Ambulatory Visit (INDEPENDENT_AMBULATORY_CARE_PROVIDER_SITE_OTHER): Payer: PPO | Admitting: Pharmacist

## 2021-02-23 VITALS — BP 130/72

## 2021-02-23 DIAGNOSIS — I10 Essential (primary) hypertension: Secondary | ICD-10-CM

## 2021-02-23 DIAGNOSIS — R7303 Prediabetes: Secondary | ICD-10-CM

## 2021-02-23 NOTE — Patient Instructions (Addendum)
Visit Information   Goals Addressed             This Visit's Progress    Track and Manage My Blood Pressure-Hypertension   On track    Timeframe:  Long-Range Goal Priority:  High Start Date:  10/06/20                           Expected End Date: 04/08/20                       Follow Up Date 12/23/20    - check blood pressure 3 times per week - choose a place to take my blood pressure (home, clinic or office, retail store) - write blood pressure results in a log or diary    Why is this important?   You won't feel high blood pressure, but it can still hurt your blood vessels.  High blood pressure can cause heart or kidney problems. It can also cause a stroke.  Making lifestyle changes like losing a little weight or eating less salt will help.  Checking your blood pressure at home and at different times of the day can help to control blood pressure.  If the doctor prescribes medicine remember to take it the way the doctor ordered.  Call the office if you cannot afford the medicine or if there are questions about it.     Notes:        Patient Care Plan: General Pharmacy (Adult)     Problem Identified: PVCs, Pre-DM, Arthritis, HTN   Priority: High  Onset Date: 10/06/2020     Long-Range Goal: Patient-Specific Goal   Start Date: 10/06/2020  Expected End Date: 04/08/2021  Recent Progress: On track  Priority: High  Note:   Current Barriers:  Unable to achieve control of blood pressure   Pharmacist Clinical Goal(s):  Patient will achieve adherence to monitoring guidelines and medication adherence to achieve therapeutic efficacy achieve control of BP as evidenced by home monitoring adhere to plan to optimize therapeutic regimen for HTN as evidenced by report of adherence to recommended medication management changes contact provider office for questions/concerns as evidenced notation of same in electronic health record through collaboration with PharmD and provider.    Interventions: 1:1 collaboration with Susy Frizzle, MD regarding development and update of comprehensive plan of care as evidenced by provider attestation and co-signature Inter-disciplinary care team collaboration (see longitudinal plan of care) Comprehensive medication review performed; medication list updated in electronic medical record  Hypertension (BP goal <140/90) -Not ideally controlled -Current treatment: Losartan 100mg  daily HCTZ 12.5mg  daily Metoprolol XL 100mg  daily -Medications previously tried: none noted  -Current home readings: 130s-140s/70-80s -Current dietary habits: cheerios for breakfast with banana and blueberries, sandwich for lunch -Current exercise habits: house work, no specific exercise plan -Reports hypotensive/hypertensive symptoms including dizziness -Educated on BP goals and benefits of medications for prevention of heart attack, stroke and kidney damage; Daily salt intake goal < 2300 mg; Exercise goal of 150 minutes per week; Importance of home blood pressure monitoring; Proper BP monitoring technique; Symptoms of hypotension and importance of maintaining adequate hydration; -Counseled to monitor BP at home 2-3 times per week, document, and provide log at future appointments -Recommended to continue current medication Recommended increase home monitoring Contact provider if BP consistently > 140/90 - f/u plan initiated  Update 02/23/21 Has been checking blood pressure at home about once or twice per week.  Typically  runs in the 130s/80s.  He feels like it is working better for him than the last time we talked. No changes to medication needed at this time.  Updated blood pressure in chart with patient-reported home BP from 01/03/21.  BP Readings from Last 3 Encounters:  01/03/21 130/72  09/08/20 (!) 144/70  02/29/20 (!) 144/86   No changes to medications needed at this time. Continue to monitor at home with goal <  140/90.    Hyperlipidemia: (LDL goal < 100) -Controlled -Current treatment: None -Medications previously tried: statins  -Current dietary patterns: see DM -Current exercise habits: minimal -Educated on Cholesterol goals;  Importance of limiting foods high in cholesterol; Exercise goal of 150 minutes per week; Most recent LDL was excellent -Recommended continue current management strategies  Pre-Diabetes (A1c goal <7%) -Controlled -Current medications: Metformin 500mg  BID with meals -Medications previously tried: none noted  -Current home glucose readings fasting glucose: 130-150s post prandial glucose: 160s -Denies hypoglycemic/hyperglycemic symptoms -Current meal patterns:  breakfast: cheerios with blueberries and banana  lunch: sandwich  dinner:  snacks: ice cream sandwiches (low sugar) at night drinks:  -Current exercise: none - just typical housework -Educated on A1c and blood sugar goals; Complications of diabetes including kidney damage, retinal damage, and cardiovascular disease; Prevention and management of hypoglycemic episodes; -Counseled to check feet daily and get yearly eye exams -Recommended to continue current medication  Update 02/23/21 Reports fasting glucose is around 130 when he checks in the morning.  He did inform me that he is now only taking metformin once daily in the morning.  Recommended he come in to get A1c checked to see how his sugar is responding to this.  He agreed to schedule a visit at the beginning of the year.  If he is going to continue on the reduced dose, he will need to have new rx sent in to represent updated sig. Continue to work on late night snacking, and limit carbohdrates. Recheck A1c in January to see if he needs to resume increased Metformin dose.   Osteoarthritis (Goal: Minimize symptoms) -Not ideally controlled -Current treatment  none -Medications previously tried: Meloxicam (burned stomach)  -no longer taking  meloxicam since it burned his stomach, not interested in cortisone injections at this time -Recommended Tylenol, contact providers with worsening symptoms  Patient Goals/Self-Care Activities Patient will:  - take medications as prescribed focus on medication adherence by pill counts check glucose daily, document, and provide at future appointments check blood pressure a few times per week, document, and provide at future appointments  Follow Up Plan: The care management team will reach out to the patient again over the next 120 days.           Patient verbalizes understanding of instructions provided today and agrees to view in [place].  Telephone follow up appointment with pharmacy team member scheduled for: 6 months  Edythe Clarity, [place]

## 2021-02-23 NOTE — Progress Notes (Signed)
Chronic Care Management Pharmacy Note  02/23/2021 Name:  James Harper MRN:  [id number] DOB:  21-May-1945  Summary: Initial visit with PharmD for CCM.  Med list updated.  No longer taking meloxicam, burned his stomach.  BP since started HCTZ has been 173V to 670L systolic.  He has not noticed much of a difference.  Recommendations/Changes made from today's visit: Continue to monitor BP - follow up plan started, consider increase to 41m daily if BP not at goal  Plan: BP check in 30 days FU with PharmD in 4 months   Subjective: James GHARIBIANis an 75y.o. year old male who is a primary patient of Pickard, WCammie Mcgee MD.  The CCM team was consulted for assistance with disease management and care coordination needs.    Engaged with patient face to face for initial visit in response to provider referral for pharmacy case management and/or care coordination services.   Consent to Services:  The patient was given the following information about Chronic Care Management services today, agreed to services, and gave verbal consent: 1. CCM service includes personalized support from designated clinical staff supervised by the primary care provider, including individualized plan of care and coordination with other care providers 2. 24/7 contact phone numbers for assistance for urgent and routine care needs. 3. Service will only be billed when office clinical staff spend 20 minutes or more in a month to coordinate care. 4. Only one practitioner may furnish and bill the service in a calendar month. 5.The patient may stop CCM services at any time (effective at the end of the month) by phone call to the office staff. 6. The patient will be responsible for cost sharing (co-pay) of up to 20% of the service fee (after annual deductible is met). Patient agreed to services and consent obtained.  Patient Care Team: PSusy Frizzle MD as PCP - General (Family Medicine) DEdythe Clarity [facility] Centeras Pharmacist  (Pharmacist)  Recent office visits:  09/08/20 Dr. PDennard Schaumann For follow-up. STARTED Meloxicam 15 mg daily. STOPPED Atorvastatin.   Recent consult visits:  None in the last six months   Hospital visits:  None in previous 6 months     Objective:  Lab Results  Component Value Date   CREATININE 1.00 09/08/2020   BUN 15 09/08/2020   GFRNONAA 73 09/08/2020   GFRAA 85 09/08/2020   NA 139 09/08/2020   K 5.1 09/08/2020   CALCIUM 10.2 09/08/2020   CO2 29 09/08/2020   GLUCOSE 122 (H) 09/08/2020    Lab Results  Component Value Date/Time   HGBA1C 6.2 (H) 09/08/2020 08:16 AM   HGBA1C 6.0 (H) 02/29/2020 08:21 AM   MICROALBUR 1.1 09/08/2020 08:16 AM   MICROALBUR 1.1 02/29/2020 08:21 AM    Last diabetic Eye exam: No results found for: HMDIABEYEEXA  Last diabetic Foot exam: No results found for: HMDIABFOOTEX   Lab Results  Component Value Date   CHOL 136 09/08/2020   HDL 54 09/08/2020   LDLCALC 65 09/08/2020   TRIG 87 09/08/2020   CHOLHDL 2.5 09/08/2020    Hepatic Function Latest Ref Rng & Units 09/08/2020 02/29/2020 01/15/2019  Total Protein 6.1 - 8.1 g/dL 7.4 7.6 7.8  Albumin 3.6 - 5.1 g/dL - - -  AST 10 - 35 U/L _0 ALT 9 - 46 U/L _1 Alk Phosphatase 40 - 115 U/L - - -  Total Bilirubin 0.2 - 1.2 mg/dL 0.8 0.7 0.9  No results found for: TSH, FREET4  CBC Latest Ref Rng & Units 09/08/2020 02/29/2020 01/15/2019  WBC 3.8 - 10.8 Thousand/uL 9.2 8.7 8.5  Hemoglobin 13.2 - 17.1 g/dL 15.0 15.1 15.7  Hematocrit 38.5 - 50.0 % 46.2 44.8 47.0  Platelets 140 - 400 Thousand/uL 252 277 248    No results found for: VD25OH  Clinical ASCVD: Yes  The 10-year ASCVD risk score (Arnett DK, et al., 2019) is: 45.9%   Values used to calculate the score:     Age: 75 years     Sex: Male     Is Non-Hispanic African American: No     Diabetic: Yes     Tobacco smoker: Yes     Systolic Blood Pressure: 161 mmHg     Is BP treated: Yes     HDL Cholesterol: 54 mg/dL     Total  Cholesterol: 136 mg/dL    Depression screen [facility] 2/9 12/11/2020 09/08/2020 01/17/2017  Decreased Interest 0 0 0  Down, Depressed, Hopeless 0 0 0  PHQ - 2 Score 0 0 0     Social History   Tobacco Use  Smoking Status Former   Packs/day: 0.25   Years: 15.00   Pack years: 3.75   Types: Cigarettes   Quit date: 08/19/1972   Years since quitting: 48.5  Smokeless Tobacco Never   BP Readings from Last 3 Encounters:  01/03/21 130/72  09/08/20 (!) 144/70  02/29/20 (!) 144/86   Pulse Readings from Last 3 Encounters:  09/08/20 76  02/29/20 68  09/17/19 64   Wt Readings from Last 3 Encounters:  09/08/20 196 lb (88.9 kg)  02/29/20 202 lb (91.6 kg)  09/17/19 206 lb 6.4 oz (93.6 kg)   BMI Readings from Last 3 Encounters:  09/08/20 28.94 kg/m  02/29/20 29.83 kg/m  09/17/19 30.48 kg/m    Assessment/Interventions: Review of patient past medical history, allergies, medications, health status, including review of consultants reports, laboratory and other test data, was performed as part of comprehensive evaluation and provision of chronic care management services.   SDOH:  (Social Determinants of Health) assessments and interventions performed: Yes  Financial Resource Strain: Low Risk    Difficulty of Paying Living Expenses: Not very hard    SDOH Screenings   Alcohol Screen: Low Risk    Last Alcohol Screening Score (AUDIT): 0  Depression (PHQ2-9): Low Risk    PHQ-2 Score: 0  Financial Resource Strain: Low Risk    Difficulty of Paying Living Expenses: Not very hard  Food Insecurity: Not on file  Housing: Not on file  Physical Activity: Not on file  Social Connections: Not on file  Stress: Not on file  Tobacco Use: Medium Risk   Smoking Tobacco Use: Former   Smokeless Tobacco Use: Never   Passive Exposure: Not on file  Transportation Needs: Not on file    CCM Care Plan  Allergies  Allergen Reactions   Omnipaque [Iohexol] Nausea And Vomiting    Pt states he has severe  vomiting with IV contrast, especially the older kind he had over 20 yrs ago.  He had it one other time, it was injected slower and he said he did better.   Norvasc [Amlodipine Besylate] Swelling    Medications Reviewed Today     Reviewed by Edythe Clarity, [facility] (Pharmacist) on 02/23/21 at [place] List Status: <None>   Medication Order Taking? Sig Documenting Provider Last Dose Status Informant  aspirin 81 MG chewable tablet [id number] Yes Chew  1 tablet (81 mg total) by mouth daily. Susy Frizzle, MD Taking Active   hydrochlorothiazide (HYDRODIURIL) 12.5 MG tablet [id number] Yes Take 1 tablet (12.5 mg total) by mouth daily. Susy Frizzle, MD Taking Active   losartan (COZAAR) 100 MG tablet [id number] Yes TAKE ONE TABLET (100MG TOTAL) BY MOUTH DAILY Susy Frizzle, MD Taking Active   meloxicam (MOBIC) 15 MG tablet [id number] Yes Take 1 tablet (15 mg total) by mouth daily. 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   Hep C w/o coma, chronic ([place])    Prediabetes     Immunization History  Administered Date(s) Administered   Fluad Quad(high Dose 65+) 01/15/2019, 02/29/2020   Influenza,inj,Quad PF,6+ Mos 01/17/2017   Moderna Sars-Covid-2 Vaccination 11/05/2019, 12/03/2019   Pneumococcal Polysaccharide-23 01/17/2017    Conditions to be addressed/monitored:  PVCs, Pre-DM, Arthritis, HTN  Care Plan : General Pharmacy (Adult)  Updates made by Edythe Clarity, RPH since  02/23/2021 12:00 AM     Problem: PVCs, Pre-DM, Arthritis, HTN   Priority: High  Onset Date: 10/06/2020     Long-Range Goal: Patient-Specific Goal   Start Date: 10/06/2020  Expected End Date: 04/08/2021  Recent Progress: On track  Priority: High  Note:   Current Barriers:  Unable to achieve control of blood pressure   Pharmacist Clinical Goal(s):  Patient will achieve adherence to monitoring guidelines and medication adherence to achieve therapeutic efficacy achieve control of BP as evidenced by home monitoring adhere to plan to optimize therapeutic regimen for HTN as evidenced by report of adherence to recommended medication management changes contact provider office for questions/concerns as evidenced notation of same in electronic health record through collaboration with PharmD and provider.   Interventions: 1:1 collaboration with Susy Frizzle, MD regarding development and update of comprehensive plan of care as evidenced by provider attestation and co-signature Inter-disciplinary care team collaboration (see longitudinal plan of care) Comprehensive medication review performed; medication list updated in electronic medical record  Hypertension (BP goal <140/90) -Not ideally controlled -Current treatment: Losartan 176m daily HCTZ 12.537mdaily Metoprolol XL 10045maily -Medications previously tried: none noted  -Current home readings: 130s-140s/70-80s -Current dietary habits: cheerios for breakfast with banana and blueberries, sandwich for lunch -Current exercise habits: house work, no specific exercise plan -Reports hypotensive/hypertensive symptoms including dizziness -Educated on BP goals and benefits of medications for prevention of heart attack, stroke and kidney damage; Daily salt intake goal < 2300 mg; Exercise goal of 150 minutes per week; Importance of home blood pressure monitoring; Proper BP monitoring technique; Symptoms of hypotension and importance of  maintaining adequate hydration; -Counseled to monitor BP at home 2-3 times per week, document, and provide log at future appointments -Recommended to continue current medication Recommended increase home monitoring Contact provider if BP consistently > 140/90 - f/u plan initiated  Update 02/23/21 Has been checking blood pressure at home about once or twice per  week.  Typically runs in the 130s/80s.  He feels like it is working better for him than the last time we talked. No changes to medication needed at this time.  Updated blood pressure in chart with patient-reported home BP from 01/03/21.  BP Readings from Last 3 Encounters:  01/03/21 130/72  09/08/20 (!) 144/70  02/29/20 (!) 144/86   No changes to medications needed at this time. Continue to monitor at home with goal < 140/90.    Hyperlipidemia: (LDL goal < 100) -Controlled -Current treatment: None -Medications previously tried: statins  -Current dietary patterns: see DM -Current exercise habits: minimal -Educated on Cholesterol goals;  Importance of limiting foods high in cholesterol; Exercise goal of 150 minutes per week; Most recent LDL was excellent -Recommended continue current management strategies  Pre-Diabetes (A1c goal <7%) -Controlled -Current medications: Metformin 550m BID with meals -Medications previously tried: none noted  -Current home glucose readings fasting glucose: 130-150s post prandial glucose: 160s -Denies hypoglycemic/hyperglycemic symptoms -Current meal patterns:  breakfast: cheerios with blueberries and banana  lunch: sandwich  dinner:  snacks: ice cream sandwiches (low sugar) at night drinks:  -Current exercise: none - just typical housework -Educated on A1c and blood sugar goals; Complications of diabetes including kidney damage, retinal damage, and cardiovascular disease; Prevention and management of hypoglycemic episodes; -Counseled to check feet daily and get yearly eye  exams -Recommended to continue current medication  Update 02/23/21 Reports fasting glucose is around 130 when he checks in the morning.  He did inform me that he is now only taking metformin once daily in the morning.  Recommended he come in to get A1c checked to see how his sugar is responding to this.  He agreed to schedule a visit at the beginning of the year.  If he is going to continue on the reduced dose, he will need to have new rx sent in to represent updated sig. Continue to work on late night snacking, and limit carbohdrates. Recheck A1c in January to see if he needs to resume increased Metformin dose.   Osteoarthritis (Goal: Minimize symptoms) -Not ideally controlled -Current treatment  none -Medications previously tried: Meloxicam (burned stomach)  -no longer taking meloxicam since it burned his stomach, not interested in cortisone injections at this time -Recommended Tylenol, contact providers with worsening symptoms  Patient Goals/Self-Care Activities Patient will:  - take medications as prescribed focus on medication adherence by pill counts check glucose daily, document, and provide at future appointments check blood pressure a few times per week, document, and provide at future appointments  Follow Up Plan: The care management team will reach out to the patient again over the next 120 days.            Medication Assistance: None required.  Patient affirms current coverage meets needs.  Compliance/Adherence/Medication fill history: Care Gaps: Colonoscopy PNA vaccine  Star-Rating Drugs: Metformin 5057m06/11/22 90ds HCTZ 12.76m42m6/21/22 90ds Losartan 100m50mily 08/08/20 90ds  [facility]:  [facility]: [phone]: [phone] pill box? No - has own system Pt endorses 100% compliance  We discussed: Benefits of medication  synchronization, packaging and delivery as well as enhanced pharmacist oversight with [facility]. Patient decided to: Continue current medication management strategy  Care Plan and Follow Up Patient Decision:  Patient agrees to Care Plan and Follow-up.  Plan: The care management team will reach out to the patient  again over the next 120 days.  Beverly Milch, PharmD Clinical Pharmacist [place] [phone]

## 2021-03-01 DIAGNOSIS — Z1211 Encounter for screening for malignant neoplasm of colon: Secondary | ICD-10-CM | POA: Diagnosis not present

## 2021-03-05 LAB — IFOBT (OCCULT BLOOD): IFOBT: NEGATIVE

## 2021-03-15 ENCOUNTER — Other Ambulatory Visit: Payer: Self-pay | Admitting: Family Medicine

## 2021-03-15 DIAGNOSIS — I10 Essential (primary) hypertension: Secondary | ICD-10-CM

## 2021-03-25 DIAGNOSIS — I10 Essential (primary) hypertension: Secondary | ICD-10-CM

## 2021-04-12 ENCOUNTER — Telehealth: Payer: Self-pay | Admitting: Pharmacist

## 2021-04-12 NOTE — Progress Notes (Signed)
° ° °  Chronic Care Management Pharmacy Assistant   Name: James Harper  MRN: [id number] DOB: [birth year]   Reason for Encounter: Disease State - Diabetes Call     Recent office visits:  None noted.   Recent consult visits:  None noted.   Hospital visits:  None in previous 6 months  Medications: Outpatient Encounter Medications as of 04/12/2021  Medication Sig   aspirin 81 MG chewable tablet Chew 1 tablet (81 mg total) by mouth daily.   hydrochlorothiazide (HYDRODIURIL) 12.5 MG tablet TAKE ONE TABLET (12.5MG  TOTAL) BY MOUTH DAILY   losartan (COZAAR) 100 MG tablet TAKE ONE TABLET (100MG  TOTAL) BY MOUTH DAILY   meloxicam (MOBIC) 15 MG tablet Take 1 tablet (15 mg total) by mouth daily.   metFORMIN (GLUCOPHAGE) 500 MG tablet TAKE ONE TABLET (500MG  TOTAL) BY MOUTH TWO TIMES DAILY WITH A MEAL   metoprolol succinate (TOPROL-XL) 50 MG 24 hr tablet TAKE ONE TABLET (50MG  TOTAL) BY MOUTH DAILY   No facility-administered encounter medications on file as of 04/12/2021.    Current antihyperglycemic regimen:  Metformin 500mg  BID with meals   What recent interventions/DTPs have been made to improve glycemic control:  Patient reported no changes in medication regimen.   Have there been any recent hospitalizations or ED visits since last visit with CPP?  Patient has had no hospitalizations or ED visits since last visit with CPP.   Patient denies hypoglycemic symptoms, including Pale, Sweaty, Shaky, Hungry, Nervous/irritable, and Vision changes   Patient denies hyperglycemic symptoms, including blurry vision, excessive thirst, fatigue, polyuria, and weakness   How often are you checking your blood sugar? Patient reported checking blood sugars daily every am.   What are your blood sugars ranging?  Fasting: 130s  Before meals:  After meals:  Bedtime:   During the week, how often does your blood glucose drop below 70?  Patient reported no readings below 70  Are you checking your feet  daily/regularly? Patient reported he does check his feet regularly and currently has no concerns.     Adherence Review: Is the patient currently on a STATIN medication? No Is the patient currently on ACE/ARB medication? Yes Does the patient have >5 day gap between last estimated fill dates? No   Care Gaps  AWV: done 12/11/20 Colonoscopy: unknown  DM Eye Exam: N/A DM Foot Exam:N/A Microalbumin: done 09/08/20 HbgAIC: done 09/08/20 (6.2) DEXA: N/A Mammogram: N/A  Star Rating Drugs: losartan (COZAAR) 100 MG tablet - last filled 04/10/20 90 days  metFORMIN (GLUCOPHAGE) 500 MG tablet - last filled 12/20/20 90 days   Future Appointments  Date Time Provider [place]  04/25/2021  3:00 PM Annitta Needs, NP [facility]  08/31/2021  2:15 PM [facility] PHARMACIST [facility] None    Jobe Gibbon, [facility] Clinical Pharmacist Assistant  [phone]

## 2021-04-25 ENCOUNTER — Other Ambulatory Visit: Payer: Self-pay

## 2021-04-25 ENCOUNTER — Ambulatory Visit: Payer: PPO | Admitting: Gastroenterology

## 2021-04-25 ENCOUNTER — Encounter: Payer: Self-pay | Admitting: Gastroenterology

## 2021-04-25 DIAGNOSIS — R1310 Dysphagia, unspecified: Secondary | ICD-10-CM | POA: Diagnosis not present

## 2021-04-25 DIAGNOSIS — Z1211 Encounter for screening for malignant neoplasm of colon: Secondary | ICD-10-CM | POA: Diagnosis not present

## 2021-04-25 MED ORDER — NA SULFATE-K SULFATE-MG SULF 17.5-3.13-1.6 GM/177ML PO SOLN: 1.0000 | ORAL | 0 refills | Status: DC

## 2021-04-25 NOTE — Patient Instructions (Signed)
We are arranging a colonoscopy with upper endoscopy and dilation by Dr. Abbey Chatters in the near future!  Continue Prilosec. I recommend daily, but if you want to wait until after the endoscopy, that is fine as well.  Please do not take metformin the day of the procedure.  Further recommendations to follow!  It was a pleasure to see you today. I want to create trusting relationships with patients to provide genuine, compassionate, and quality care. I value your feedback. If you receive a survey regarding your visit,  I greatly appreciate you taking time to fill this out.   Annitta Needs, PhD, ANP-BC [facility]

## 2021-04-25 NOTE — H&P (View-Only) (Signed)
Primary Care Physician:  Susy Frizzle, MD Referring Physician: Dr. Dennard Schaumann Primary Gastroenterologist:  Dr. Abbey Chatters   Chief Complaint  Patient presents with   Consult    TCS last done about 15-20 years ago. No FH colon cancer/polyps    HPI:   James Harper is a 76 y.o. male presenting today at the request of Dr. Dennard Schaumann for screening colonoscopy. Last colonoscopy 15-20 years ago. No personal history of polyps. No family history of colorectal cancer or polyps.    States his RLQ and LLQ groin may grab if moving too quick. History of GERD. Notes chronic history of dysphagia. Has had dilation in the past, around time of colonoscopy many years ago. Remote stretching many years ago. Takes OTC Prilosec about three times a week.   History of Hep C and treated when he was in his 27s. HCV RNA quant negative Oct 2020.   Past Medical History:  Diagnosis Date   GERD (gastroesophageal reflux disease)    Hep C w/o coma, chronic (HCC)    Hypertension    Prediabetes     Past Surgical History:  Procedure Laterality Date   BACK SURGERY  1980    Current Outpatient Medications  Medication Sig Dispense Refill   aspirin 81 MG chewable tablet Chew 1 tablet (81 mg total) by mouth daily. 90 tablet 0   hydrochlorothiazide (HYDRODIURIL) 12.5 MG tablet TAKE ONE TABLET (12.5MG  TOTAL) BY MOUTH DAILY 90 tablet 3   losartan (COZAAR) 100 MG tablet TAKE ONE TABLET (100MG  TOTAL) BY MOUTH DAILY 90 tablet 3   metFORMIN (GLUCOPHAGE) 500 MG tablet TAKE ONE TABLET (500MG  TOTAL) BY MOUTH TWO TIMES DAILY WITH A MEAL 180 tablet 1   metoprolol succinate (TOPROL-XL) 50 MG 24 hr tablet TAKE ONE TABLET (50MG  TOTAL) BY MOUTH DAILY 90 tablet 2   meloxicam (MOBIC) 15 MG tablet Take 1 tablet (15 mg total) by mouth daily. (Patient not taking: Reported on 04/25/2021) 30 tablet 0   No current facility-administered medications for this visit.    Allergies as of 04/25/2021 - Review Complete 04/25/2021  Allergen  Reaction Noted   Omnipaque [iohexol] Nausea And Vomiting 02/13/2012   Norvasc [amlodipine besylate] Swelling 08/19/2012    Family History  Problem Relation Age of Onset   Heart attack Mother    Heart disease Mother    Heart disease Sister    Heart disease Brother    Diabetes Brother    Diabetes Sister     Social History   Socioeconomic History   Marital status: Married    Spouse name: Not on file   Number of children: 2   Years of education: Not on file   Highest education level: Not on file  Occupational History   Not on file  Tobacco Use   Smoking status: Former    Packs/day: 0.25    Years: 15.00    Pack years: 3.75    Types: Cigarettes    Quit date: 08/19/1972    Years since quitting: 48.7   Smokeless tobacco: Never  Vaping Use   Vaping Use: Never used  Substance and Sexual Activity   Alcohol use: Yes    Comment: occ   Drug use: Never   Sexual activity: Not on file  Other Topics Concern   Not on file  Social History Narrative   Not on file   Social Determinants of Health   Financial Resource Strain: Low Risk    Difficulty of Paying Living Expenses: Not very  hard  Food Insecurity: Not on file  Transportation Needs: Not on file  Physical Activity: Not on file  Stress: Not on file  Social Connections: Not on file  Intimate Partner Violence: Not on file    Review of Systems: Gen: Denies any fever, chills, fatigue, weight loss, lack of appetite.  CV: Denies chest pain, heart palpitations, peripheral edema, syncope.  Resp: Denies shortness of breath at rest or with exertion. Denies wheezing or cough.  GI: see HPI GU : Denies urinary burning, urinary frequency, urinary hesitancy MS: Denies joint pain, muscle weakness, cramps, or limitation of movement.  Derm: Denies rash, itching, dry skin Psych: Denies depression, anxiety, memory loss, and confusion Heme: Denies bruising, bleeding, and enlarged lymph nodes.  Physical Exam: BP (!) 156/77    Pulse 65     Temp (!) 97.3 F (36.3 C)    Ht 5\' 9"  (1.753 m)    Wt 200 lb 12.8 oz (91.1 kg)    BMI 29.65 kg/m  General:   Alert and oriented. Pleasant and cooperative. Well-nourished and well-developed.  Head:  Normocephalic and atraumatic. Eyes:  Without icterus, sclera clear and conjunctiva pink.  Ears:  Normal auditory acuity. Mouth:  mask in place Lungs:  Clear to auscultation bilaterally. No wheezes, rales, or rhonchi. No distress.  Heart:  S1, S2 present without murmurs appreciated.  Abdomen:  +BS, soft, non-tender and non-distended. No HSM noted. No guarding or rebound. No masses appreciated.  Rectal:  Deferred  Msk:  Symmetrical without gross deformities. Normal posture. Extremities:  Without edema. Neurologic:  Alert and  oriented x4;  grossly normal neurologically. Skin:  Intact without significant lesions or rashes. Psych:  Alert and cooperative. Normal mood and affect.  ASSESSMENT: James Harper is a 76 y.o. male presenting today at the request of Dr. Dennard Schaumann for screening colonoscopy. Last colonoscopy 15-20 years ago. No personal history of polyps. No family history of colorectal cancer or polyps.   He has no concerning lower GI signs/symptoms.  Chronic GERD: with intermittent dysphagia. Remote history of dilation many years ago. Currently only taking omeprazole prn.   PLAN: Proceed with colonoscopy/EGD/dilation by Dr. Abbey Chatters  in near future: the risks, benefits, and alternatives have been discussed with the patient in detail. The patient states understanding and desires to proceed.   Recommend omeprazole daily.   No metformin day of procedure  Further recommendations to follow   Annitta Needs, PhD, ANP-BC [facility]

## 2021-04-25 NOTE — Progress Notes (Signed)
Primary Care Physician:  Susy Frizzle, MD Referring Physician: Dr. Dennard Schaumann Primary Gastroenterologist:  Dr. Abbey Chatters   Chief Complaint  Patient presents with   Consult    TCS last done about 15-20 years ago. No FH colon cancer/polyps    HPI:   James Harper is a 76 y.o. male presenting today at the request of Dr. Dennard Schaumann for screening colonoscopy. Last colonoscopy 15-20 years ago. No personal history of polyps. No family history of colorectal cancer or polyps.    States his RLQ and LLQ groin may grab if moving too quick. History of GERD. Notes chronic history of dysphagia. Has had dilation in the past, around time of colonoscopy many years ago. Remote stretching many years ago. Takes OTC Prilosec about three times a week.   History of Hep C and treated when he was in his 69s. HCV RNA quant negative Oct 2020.   Past Medical History:  Diagnosis Date   GERD (gastroesophageal reflux disease)    Hep C w/o coma, chronic (HCC)    Hypertension    Prediabetes     Past Surgical History:  Procedure Laterality Date   BACK SURGERY  1980    Current Outpatient Medications  Medication Sig Dispense Refill   aspirin 81 MG chewable tablet Chew 1 tablet (81 mg total) by mouth daily. 90 tablet 0   hydrochlorothiazide (HYDRODIURIL) 12.5 MG tablet TAKE ONE TABLET (12.5MG  TOTAL) BY MOUTH DAILY 90 tablet 3   losartan (COZAAR) 100 MG tablet TAKE ONE TABLET (100MG  TOTAL) BY MOUTH DAILY 90 tablet 3   metFORMIN (GLUCOPHAGE) 500 MG tablet TAKE ONE TABLET (500MG  TOTAL) BY MOUTH TWO TIMES DAILY WITH A MEAL 180 tablet 1   metoprolol succinate (TOPROL-XL) 50 MG 24 hr tablet TAKE ONE TABLET (50MG  TOTAL) BY MOUTH DAILY 90 tablet 2   meloxicam (MOBIC) 15 MG tablet Take 1 tablet (15 mg total) by mouth daily. (Patient not taking: Reported on 04/25/2021) 30 tablet 0   No current facility-administered medications for this visit.    Allergies as of 04/25/2021 - Review Complete 04/25/2021  Allergen  Reaction Noted   Omnipaque [iohexol] Nausea And Vomiting 02/13/2012   Norvasc [amlodipine besylate] Swelling 08/19/2012    Family History  Problem Relation Age of Onset   Heart attack Mother    Heart disease Mother    Heart disease Sister    Heart disease Brother    Diabetes Brother    Diabetes Sister     Social History   Socioeconomic History   Marital status: Married    Spouse name: Not on file   Number of children: 2   Years of education: Not on file   Highest education level: Not on file  Occupational History   Not on file  Tobacco Use   Smoking status: Former    Packs/day: 0.25    Years: 15.00    Pack years: 3.75    Types: Cigarettes    Quit date: 08/19/1972    Years since quitting: 48.7   Smokeless tobacco: Never  Vaping Use   Vaping Use: Never used  Substance and Sexual Activity   Alcohol use: Yes    Comment: occ   Drug use: Never   Sexual activity: Not on file  Other Topics Concern   Not on file  Social History Narrative   Not on file   Social Determinants of Health   Financial Resource Strain: Low Risk    Difficulty of Paying Living Expenses: Not very  hard  Food Insecurity: Not on file  Transportation Needs: Not on file  Physical Activity: Not on file  Stress: Not on file  Social Connections: Not on file  Intimate Partner Violence: Not on file    Review of Systems: Gen: Denies any fever, chills, fatigue, weight loss, lack of appetite.  CV: Denies chest pain, heart palpitations, peripheral edema, syncope.  Resp: Denies shortness of breath at rest or with exertion. Denies wheezing or cough.  GI: see HPI GU : Denies urinary burning, urinary frequency, urinary hesitancy MS: Denies joint pain, muscle weakness, cramps, or limitation of movement.  Derm: Denies rash, itching, dry skin Psych: Denies depression, anxiety, memory loss, and confusion Heme: Denies bruising, bleeding, and enlarged lymph nodes.  Physical Exam: BP (!) 156/77    Pulse 65     Temp (!) 97.3 F (36.3 C)    Ht 5\' 9"  (1.753 m)    Wt 200 lb 12.8 oz (91.1 kg)    BMI 29.65 kg/m  General:   Alert and oriented. Pleasant and cooperative. Well-nourished and well-developed.  Head:  Normocephalic and atraumatic. Eyes:  Without icterus, sclera clear and conjunctiva pink.  Ears:  Normal auditory acuity. Mouth:  mask in place Lungs:  Clear to auscultation bilaterally. No wheezes, rales, or rhonchi. No distress.  Heart:  S1, S2 present without murmurs appreciated.  Abdomen:  +BS, soft, non-tender and non-distended. No HSM noted. No guarding or rebound. No masses appreciated.  Rectal:  Deferred  Msk:  Symmetrical without gross deformities. Normal posture. Extremities:  Without edema. Neurologic:  Alert and  oriented x4;  grossly normal neurologically. Skin:  Intact without significant lesions or rashes. Psych:  Alert and cooperative. Normal mood and affect.  ASSESSMENT: James Harper is a 76 y.o. male presenting today at the request of Dr. Dennard Schaumann for screening colonoscopy. Last colonoscopy 15-20 years ago. No personal history of polyps. No family history of colorectal cancer or polyps.   He has no concerning lower GI signs/symptoms.  Chronic GERD: with intermittent dysphagia. Remote history of dilation many years ago. Currently only taking omeprazole prn.   PLAN: Proceed with colonoscopy/EGD/dilation by Dr. Abbey Chatters  in near future: the risks, benefits, and alternatives have been discussed with the patient in detail. The patient states understanding and desires to proceed.   Recommend omeprazole daily.   No metformin day of procedure  Further recommendations to follow   Annitta Needs, PhD, ANP-BC [facility]

## 2021-05-18 ENCOUNTER — Other Ambulatory Visit (HOSPITAL_COMMUNITY)
Admission: RE | Admit: 2021-05-18 | Discharge: 2021-05-18 | Disposition: A | Discharge status: Home / Self Care | Payer: PPO | Source: Ambulatory Visit | Attending: Internal Medicine | Admitting: Internal Medicine

## 2021-05-18 DIAGNOSIS — Z1211 Encounter for screening for malignant neoplasm of colon: Secondary | ICD-10-CM | POA: Diagnosis not present

## 2021-05-18 DIAGNOSIS — R1310 Dysphagia, unspecified: Secondary | ICD-10-CM | POA: Diagnosis not present

## 2021-05-18 LAB — BASIC METABOLIC PANEL
Anion gap: 8 (ref 5–15)
BUN: 21 mg/dL (ref 8–23)
CO2: 27 mmol/L (ref 22–32)
Calcium: 9.6 mg/dL (ref 8.9–10.3)
Chloride: 102 mmol/L (ref 98–111)
Creatinine, Ser: 1.25 mg/dL — ABNORMAL HIGH (ref 0.61–1.24)
GFR, Estimated: 60 mL/min — ABNORMAL LOW (ref >60)
Glucose, Bld: 122 mg/dL — ABNORMAL HIGH (ref 70–99)
Potassium: 4.7 mmol/L (ref 3.5–5.1)
Sodium: 137 mmol/L (ref 135–145)

## 2021-05-19 ENCOUNTER — Ambulatory Visit (HOSPITAL_COMMUNITY)
Admission: RE | Admit: 2021-05-19 | Discharge: 2021-05-19 | Disposition: A | Discharge status: Home / Self Care | Payer: PPO | Attending: Internal Medicine | Admitting: Internal Medicine

## 2021-05-19 ENCOUNTER — Encounter (HOSPITAL_COMMUNITY)
Admission: RE | Disposition: A | Discharge status: Home / Self Care | Payer: Self-pay | Source: Home / Self Care | Attending: Internal Medicine

## 2021-05-19 ENCOUNTER — Telehealth: Payer: Self-pay | Admitting: Pharmacist

## 2021-05-19 ENCOUNTER — Ambulatory Visit (HOSPITAL_BASED_OUTPATIENT_CLINIC_OR_DEPARTMENT_OTHER): Payer: PPO | Admitting: Anesthesiology

## 2021-05-19 ENCOUNTER — Other Ambulatory Visit: Payer: Self-pay

## 2021-05-19 ENCOUNTER — Ambulatory Visit (HOSPITAL_COMMUNITY): Payer: PPO | Admitting: Anesthesiology

## 2021-05-19 ENCOUNTER — Encounter (HOSPITAL_COMMUNITY): Payer: Self-pay

## 2021-05-19 DIAGNOSIS — K648 Other hemorrhoids: Secondary | ICD-10-CM | POA: Insufficient documentation

## 2021-05-19 DIAGNOSIS — Z1211 Encounter for screening for malignant neoplasm of colon: Secondary | ICD-10-CM | POA: Diagnosis not present

## 2021-05-19 DIAGNOSIS — R7303 Prediabetes: Secondary | ICD-10-CM | POA: Insufficient documentation

## 2021-05-19 DIAGNOSIS — Z87891 Personal history of nicotine dependence: Secondary | ICD-10-CM | POA: Diagnosis not present

## 2021-05-19 DIAGNOSIS — I10 Essential (primary) hypertension: Secondary | ICD-10-CM | POA: Diagnosis not present

## 2021-05-19 DIAGNOSIS — K5730 Diverticulosis of large intestine without perforation or abscess without bleeding: Secondary | ICD-10-CM | POA: Diagnosis not present

## 2021-05-19 DIAGNOSIS — K219 Gastro-esophageal reflux disease without esophagitis: Secondary | ICD-10-CM | POA: Insufficient documentation

## 2021-05-19 DIAGNOSIS — K449 Diaphragmatic hernia without obstruction or gangrene: Secondary | ICD-10-CM | POA: Diagnosis not present

## 2021-05-19 DIAGNOSIS — R1310 Dysphagia, unspecified: Secondary | ICD-10-CM | POA: Diagnosis not present

## 2021-05-19 DIAGNOSIS — K759 Inflammatory liver disease, unspecified: Secondary | ICD-10-CM | POA: Diagnosis not present

## 2021-05-19 DIAGNOSIS — K222 Esophageal obstruction: Secondary | ICD-10-CM | POA: Diagnosis not present

## 2021-05-19 HISTORY — DX: Nausea with vomiting, unspecified: R11.2

## 2021-05-19 HISTORY — DX: Other specified postprocedural states: Z98.890

## 2021-05-19 HISTORY — PX: COLONOSCOPY WITH PROPOFOL: SHX5780

## 2021-05-19 HISTORY — PX: ESOPHAGOGASTRODUODENOSCOPY (EGD) WITH PROPOFOL: SHX5813

## 2021-05-19 HISTORY — PX: BALLOON DILATION: SHX5330

## 2021-05-19 LAB — GLUCOSE, CAPILLARY: Glucose-Capillary: 144 mg/dL — ABNORMAL HIGH (ref 70–99)

## 2021-05-19 SURGERY — COLONOSCOPY WITH PROPOFOL
Anesthesia: General

## 2021-05-19 MED ORDER — OMEPRAZOLE 20 MG PO CPDR: 20.0000 mg | DELAYED_RELEASE_CAPSULE | Freq: Two times a day (BID) | ORAL | 5 refills | Status: DC

## 2021-05-19 MED ORDER — LACTATED RINGERS IV SOLN: INTRAVENOUS | Status: DC

## 2021-05-19 MED ORDER — LIDOCAINE HCL (CARDIAC) PF 100 MG/5ML IV SOSY
PREFILLED_SYRINGE | INTRAVENOUS | Status: DC | PRN
  Administered 2021-05-19: 50 mg via INTRAVENOUS

## 2021-05-19 MED ORDER — PROPOFOL 500 MG/50ML IV EMUL
INTRAVENOUS | Status: DC | PRN
  Administered 2021-05-19: 150 ug/kg/min via INTRAVENOUS

## 2021-05-19 MED ORDER — PROPOFOL 10 MG/ML IV BOLUS
INTRAVENOUS | Status: DC | PRN
  Administered 2021-05-19: 100 mg via INTRAVENOUS

## 2021-05-19 NOTE — Interval H&P Note (Signed)
History and Physical Interval Note:  05/19/2021 8:12 AM  James Harper  has presented today for surgery, with the diagnosis of screening colonoscopy, dysphagia.  The various methods of treatment have been discussed with the patient and family. After consideration of risks, benefits and other options for treatment, the patient has consented to  Procedure(s) with comments: COLONOSCOPY WITH PROPOFOL (N/A) - 8:00am ESOPHAGOGASTRODUODENOSCOPY (EGD) WITH PROPOFOL (N/A) BALLOON DILATION (N/A) as a surgical intervention.  The patient's history has been reviewed, patient examined, no change in status, stable for surgery.  I have reviewed the patient's chart and labs.  Questions were answered to the patient's satisfaction.     Eloise Harman

## 2021-05-19 NOTE — Progress Notes (Signed)
° ° °  Chronic Care Management Pharmacy Assistant   Name: James Harper  MRN: [id number] DOB: [birth year]   Reason for Encounter: Disease State - Diabetes Call     Recent office visits:  None noted.   Recent consult visits:  04/25/21 [facility] - Gastroenterology - Dysphagia - EGD / Colonoscopy ordered.   Hospital visits:  Medication Reconciliation was completed by comparing discharge summary, patients EMR and Pharmacy list, and upon discussion with patient.  Admitted to the hospital on 05/19/21 due to Colonoscopy. Discharge date was 05/19/21. Discharged from [facility]?Medications Started at [facility] Discharge:?? None noted.   Medication Changes at Hospital Discharge: None noted.   Medications Discontinued at Hospital Discharge: None noted  Medications that remain the same after Hospital Discharge:??  All other medications will remain the same.    Medications: Outpatient Encounter Medications as of 05/19/2021  Medication Sig   aspirin 81 MG chewable tablet Chew 1 tablet (81 mg total) by mouth daily.   Barberry-Oreg Grape-Goldenseal (BERBERINE COMPLEX PO) Take 1 capsule by mouth 2 (two) times daily before a meal. Breakfast and Dinner   hydrochlorothiazide (HYDRODIURIL) 12.5 MG tablet TAKE ONE TABLET (12.5MG  TOTAL) BY MOUTH DAILY   losartan (COZAAR) 100 MG tablet TAKE ONE TABLET (100MG  TOTAL) BY MOUTH DAILY   metFORMIN (GLUCOPHAGE) 500 MG tablet TAKE ONE TABLET (500MG  TOTAL) BY MOUTH TWO TIMES DAILY WITH A MEAL   metoprolol succinate (TOPROL-XL) 50 MG 24 hr tablet TAKE ONE TABLET (50MG  TOTAL) BY MOUTH DAILY   omeprazole (PRILOSEC) 20 MG capsule Take 1 capsule (20 mg total) by mouth 2 (two) times daily before a meal. Take 30 min before breakfast and 30 min before dinner   oxymetazoline (AFRIN) 0.05 % nasal spray Place 1 spray into both nostrils 2 (two) times daily as needed for congestion.   No facility-administered encounter medications on file as of 05/19/2021.     Current antihyperglycemic regimen:  Metformin 500mg  BID with meals   What recent interventions/DTPs have been made to improve glycemic control:    Have there been any recent hospitalizations or ED visits since last visit with [facility]?    Patient  hypoglycemic symptoms, including    Patient  hyperglycemic symptoms, including    How often are you checking your blood sugar?    What are your blood sugars ranging?  Fasting:  Before meals:  After meals:  Bedtime:   During the week, how often does your blood glucose drop below 70?    Are you checking your feet daily/regularly?     Adherence Review: Is the patient currently on a STATIN medication? Yes Is the patient currently on ACE/ARB medication? Yes Does the patient have >5 day gap between last estimated fill dates? No   Care Gaps   AWV: done 12/11/20 Colonoscopy: unknown  DM Eye Exam: N/A DM Foot Exam:N/A Microalbumin: done 09/08/20 HbgAIC: done 09/08/20 (6.2) DEXA: N/A Mammogram: N/A   Star Rating Drugs: losartan (COZAAR) 100 MG tablet - last filled 05/15/21 90 days  metFORMIN (GLUCOPHAGE) 500 MG tablet - last filled 05/01/21 90 days   Future Appointments  Date Time Provider [place]  08/31/2021  2:15 PM [facility] PHARMACIST [facility] None   Multiple attempts were made to contact patient. Attempts were unsuccessful. / ls,CMA  Jobe Gibbon, [place] Pharmacist Assistant  [phone]

## 2021-05-19 NOTE — Op Note (Signed)
[facility] Patient Name: James Harper Procedure Date: 05/19/2021 8:09 AM MRN: [id number] Date of Birth: 1946/01/15 Attending MD: Elon Alas. Abbey Chatters DO CSN: [id number] Age: 76 Admit Type: Outpatient Procedure:                Upper GI endoscopy Indications:              Dysphagia Providers:                Elon Alas. Abbey Chatters, DO, Lambert Mody, Aram Candela Referring MD:              Medicines:                See the Anesthesia note for documentation of the                            administered medications Complications:            No immediate complications. Estimated Blood Loss:     Estimated blood loss was minimal. Procedure:                Pre-Anesthesia Assessment:                           - The anesthesia plan was to use monitored                            anesthesia care (MAC).                           After obtaining informed consent, the endoscope was                            passed under direct vision. Throughout the                            procedure, the patient's blood pressure, pulse, and                            oxygen saturations were monitored continuously. The                            GIF-H190 ([id number]) scope was introduced through the                            mouth, and advanced to the second part of duodenum.                            The upper GI endoscopy was accomplished without                            difficulty. The patient tolerated the procedure                            well. Scope In: 8:21:16 AM Scope Out: 8:25:08 AM  Total Procedure Duration: 0 hours 3 minutes 52 seconds  Findings:      A small hiatal hernia was present.      A moderate Schatzki ring was found in the distal esophagus. A TTS       dilator was passed through the scope. Dilation with an 18-19-20 mm       balloon dilator was performed to 18 mm. The dilation site was examined       and showed mild mucosal disruption and moderate  improvement in luminal       narrowing. This was further disrupted with cold forceps.      The entire examined stomach was normal.      The duodenal bulb, first portion of the duodenum and second portion of       the duodenum were normal. Impression:               - Small hiatal hernia.                           - Moderate Schatzki ring. Dilated.                           - Normal stomach.                           - Normal duodenal bulb, first portion of the                            duodenum and second portion of the duodenum.                           - No specimens collected. Moderate Sedation:      Per Anesthesia Care Recommendation:           - Patient has a contact number available for                            emergencies. The signs and symptoms of potential                            delayed complications were discussed with the                            patient. Return to normal activities tomorrow.                            Written discharge instructions were provided to the                            patient.                           - Resume previous diet.                           - Continue present medications.                           - Await pathology  results.                           - Repeat upper endoscopy PRN for retreatment.                           - Return to GI clinic in 4 months.                           - Use a proton pump inhibitor PO BID for 12 weeks. Procedure Code(s):        --- Professional ---                           [phone], Esophagogastroduodenoscopy, flexible,                            transoral; with transendoscopic balloon dilation of                            esophagus (less than 30 mm diameter) Diagnosis Code(s):        --- Professional ---                           K44.9, Diaphragmatic hernia without obstruction or                            gangrene                           K22.2, Esophageal obstruction                            R13.10, Dysphagia, unspecified CPT copyright 2019 [employer]. All rights reserved. The codes documented in this report are preliminary and upon coder review may  be revised to meet current compliance requirements. Elon Alas. Abbey Chatters, DO [place] Abbey Chatters, DO 05/19/2021 8:28:11 AM This report has been signed electronically. Number of Addenda: 0

## 2021-05-19 NOTE — Discharge Instructions (Addendum)
EGD Discharge instructions Please read the instructions outlined below and refer to this sheet in the next few weeks. These discharge instructions provide you with general information on caring for yourself after you leave the hospital. Your doctor may also give you specific instructions. While your treatment has been planned according to the most current medical practices available, unavoidable complications occasionally occur. If you have any problems or questions after discharge, please call your doctor. ACTIVITY You may resume your regular activity but move at a slower pace for the next 24 hours.  Take frequent rest periods for the next 24 hours.  Walking will help expel (get rid of) the air and reduce the bloated feeling in your abdomen.  No driving for 24 hours (because of the anesthesia (medicine) used during the test).  You may shower.  Do not sign any important legal documents or operate any machinery for 24 hours (because of the anesthesia used during the test).  NUTRITION Drink plenty of fluids.  You may resume your normal diet.  Begin with a light meal and progress to your normal diet.  Avoid alcoholic beverages for 24 hours or as instructed by your caregiver.  MEDICATIONS You may resume your normal medications unless your caregiver tells you otherwise.  WHAT YOU CAN EXPECT TODAY You may experience abdominal discomfort such as a feeling of fullness or gas pains.  FOLLOW-UP Your doctor will discuss the results of your test with you.  SEEK IMMEDIATE MEDICAL ATTENTION IF ANY OF THE FOLLOWING OCCUR: Excessive nausea (feeling sick to your stomach) and/or vomiting.  Severe abdominal pain and distention (swelling).  Trouble swallowing.  Temperature over 101 F (37.8 C).  Rectal bleeding or vomiting of blood.     Colonoscopy Discharge Instructions  Read the instructions outlined below and refer to this sheet in the next few weeks. These discharge instructions provide you with  general information on caring for yourself after you leave the hospital. Your doctor may also give you specific instructions. While your treatment has been planned according to the most current medical practices available, unavoidable complications occasionally occur.   ACTIVITY You may resume your regular activity, but move at a slower pace for the next 24 hours.  Take frequent rest periods for the next 24 hours.  Walking will help get rid of the air and reduce the bloated feeling in your belly (abdomen).  No driving for 24 hours (because of the medicine (anesthesia) used during the test).   Do not sign any important legal documents or operate any machinery for 24 hours (because of the anesthesia used during the test).  NUTRITION Drink plenty of fluids.  You may resume your normal diet as instructed by your doctor.  Begin with a light meal and progress to your normal diet. Heavy or fried foods are harder to digest and may make you feel sick to your stomach (nauseated).  Avoid alcoholic beverages for 24 hours or as instructed.  MEDICATIONS You may resume your normal medications unless your doctor tells you otherwise.  WHAT YOU CAN EXPECT TODAY Some feelings of bloating in the abdomen.  Passage of more gas than usual.  Spotting of blood in your stool or on the toilet paper.  IF YOU HAD POLYPS REMOVED DURING THE COLONOSCOPY: No aspirin products for 7 days or as instructed.  No alcohol for 7 days or as instructed.  Eat a soft diet for the next 24 hours.  FINDING OUT THE RESULTS OF YOUR TEST Not all test results are  available during your visit. If your test results are not back during the visit, make an appointment with your caregiver to find out the results. Do not assume everything is normal if you have not heard from your caregiver or the medical facility. It is important for you to follow up on all of your test results.  SEEK IMMEDIATE MEDICAL ATTENTION IF: You have more than a spotting of  blood in your stool.  Your belly is swollen (abdominal distention).  You are nauseated or vomiting.  You have a temperature over 101.  You have abdominal pain or discomfort that is severe or gets worse throughout the day.   Your upper endoscopy revealed a small hiatal hernia.  You also had a tightening of your esophagus called a Schatzki's ring.  I stretched this with a balloon today.  I then further disrupted it with forceps.  I am going to increase your omeprazole to 20 mg twice daily for the next 10 to 12 weeks at which point you can go down to once daily thereafter.  Your colonoscopy was relatively unremarkable.  I did not find any polyps or evidence of colon cancer.  I recommend repeating colonoscopy in 10 years for colon cancer screening purposes.  You do have diverticulosis and internal hemorrhoids. I would recommend increasing fiber in your diet or adding OTC Benefiber/Metamucil. Be sure to drink at least 4 to 6 glasses of water daily. Follow-up with Vicente Males in 4 months.    I hope you have a great rest of your week!  Elon Alas. Abbey Chatters, D.O. Gastroenterology and Hepatology [facility]

## 2021-05-19 NOTE — Op Note (Signed)
[facility] Patient Name: James Harper Procedure Date: 05/19/2021 8:05 AM MRN: [id number] Date of Birth: October 04, 1945 Attending MD: Elon Alas. Edgar Frisk CSN: [id number] Age: 76 Admit Type: Outpatient Procedure:                Colonoscopy Indications:              Screening for colorectal malignant neoplasm Providers:                Elon Alas. Abbey Chatters, DO, Lambert Mody, Aram Candela Referring MD:              Medicines:                See the Anesthesia note for documentation of the                            administered medications Complications:            No immediate complications. Estimated Blood Loss:     Estimated blood loss: none. Procedure:                Pre-Anesthesia Assessment:                           - The anesthesia plan was to use monitored                            anesthesia care (MAC).                           After obtaining informed consent, the colonoscope                            was passed under direct vision. Throughout the                            procedure, the patient's blood pressure, pulse, and                            oxygen saturations were monitored continuously. The                            [id number]-[id number] ([id number]) scope was introduced through                            the anus and advanced to the the cecum, identified                            by appendiceal orifice and ileocecal valve. The                            colonoscopy was performed without difficulty. The                            patient tolerated the procedure well. The quality  of the bowel preparation was evaluated using the                            BBPS [facility] Bowel Preparation Scale) with scores                            of: Right Colon = 3, Transverse Colon = 3 and Left                            Colon = 3 (entire mucosa seen well with no residual                            staining, small fragments of  stool or opaque                            liquid). The total BBPS score equals 9. Scope In: 8:28:47 AM Scope Out: 8:39:30 AM Scope Withdrawal Time: 0 hours 8 minutes 46 seconds  Total Procedure Duration: 0 hours 10 minutes 43 seconds  Findings:      The perianal and digital rectal examinations were normal.      Non-bleeding internal hemorrhoids were found during endoscopy.      Multiple medium-mouthed diverticula were found in the sigmoid colon.      The exam was otherwise without abnormality. Impression:               - Non-bleeding internal hemorrhoids.                           - Diverticulosis in the sigmoid colon.                           - The examination was otherwise normal.                           - No specimens collected. Moderate Sedation:      Per Anesthesia Care Recommendation:           - Patient has a contact number available for                            emergencies. The signs and symptoms of potential                            delayed complications were discussed with the                            patient. Return to normal activities tomorrow.                            Written discharge instructions were provided to the                            patient.                           - Resume previous diet.                           -  Continue present medications.                           - Repeat colonoscopy in 10 years for screening                            purposes.                           - Return to GI clinic in 4 months. Procedure Code(s):        --- Professional ---                           Z1245, Colorectal cancer screening; colonoscopy on                            individual not meeting criteria for high risk Diagnosis Code(s):        --- Professional ---                           Z12.11, Encounter for screening for malignant                            neoplasm of colon                           K64.8, Other hemorrhoids                            K57.30, Diverticulosis of large intestine without                            perforation or abscess without bleeding CPT copyright 2019 American Medical Association. All rights reserved. The codes documented in this report are preliminary and upon coder review may  be revised to meet current compliance requirements. Elon Alas. Abbey Chatters, DO Corazon Abbey Chatters, DO 05/19/2021 8:41:33 AM This report has been signed electronically. Number of Addenda: 0

## 2021-05-19 NOTE — Anesthesia Preprocedure Evaluation (Addendum)
Anesthesia Evaluation  Patient identified by MRN, date of birth, ID band Patient awake    Reviewed: Allergy & Precautions, NPO status , Patient's Chart, lab work & pertinent test results, reviewed documented beta blocker date and time   History of Anesthesia Complications (+) PONV and history of anesthetic complications  Airway Mallampati: II  TM Distance: >3 FB Neck ROM: Full    Dental  (+) Dental Advisory Given, Missing,    Pulmonary former smoker,    Pulmonary exam normal breath sounds clear to auscultation       Cardiovascular Exercise Tolerance: Good hypertension, Pt. on medications and Pt. on home beta blockers Normal cardiovascular exam+ dysrhythmias (frequent PVCS)  Rhythm:Regular Rate:Normal     Neuro/Psych negative neurological ROS  negative psych ROS   GI/Hepatic GERD  Medicated,(+) Hepatitis -, C  Endo/Other  negative endocrine ROS  Renal/GU negative Renal ROS  negative genitourinary   Musculoskeletal  (+) Arthritis ,   Abdominal   Peds negative pediatric ROS (+)  Hematology negative hematology ROS (+)   Anesthesia Other Findings   Reproductive/Obstetrics negative OB ROS                          Anesthesia Physical Anesthesia Plan  ASA: 2  Anesthesia Plan: General   Post-op Pain Management: Minimal or no pain anticipated   Induction: Intravenous  PONV Risk Score and Plan: TIVA  Airway Management Planned: Nasal Cannula and Natural Airway  Additional Equipment:   Intra-op Plan:   Post-operative Plan:   Informed Consent: I have reviewed the patients History and Physical, chart, labs and discussed the procedure including the risks, benefits and alternatives for the proposed anesthesia with the patient or authorized representative who has indicated his/her understanding and acceptance.     Dental advisory given  Plan Discussed with: CRNA and Surgeon  Anesthesia  Plan Comments:         Anesthesia Quick Evaluation

## 2021-05-19 NOTE — Anesthesia Postprocedure Evaluation (Signed)
Anesthesia Post Note  Patient: SOPHEAP BOEHLE  Procedure(s) Performed: COLONOSCOPY WITH PROPOFOL ESOPHAGOGASTRODUODENOSCOPY (EGD) WITH PROPOFOL BALLOON DILATION  Patient location during evaluation: Endoscopy Anesthesia Type: General Level of consciousness: awake and alert and oriented Pain management: pain level controlled Vital Signs Assessment: post-procedure vital signs reviewed and stable Respiratory status: spontaneous breathing, nonlabored ventilation and respiratory function stable Cardiovascular status: blood pressure returned to baseline and stable Postop Assessment: no apparent nausea or vomiting Anesthetic complications: no   No notable events documented.   Last Vitals:  Vitals:   05/19/21 0713 05/19/21 0842  BP: (!) 148/89 (!) 103/50  Resp: 13 20  Temp: 36.4 C 36.5 C  SpO2: 97% 95%    Last Pain:  Vitals:   05/19/21 0842  TempSrc: Oral  PainSc: 0-No pain                 Kaeley Vinje C Rikita Grabert

## 2021-05-19 NOTE — Transfer of Care (Signed)
Immediate Anesthesia Transfer of Care Note  Patient: James Harper  Procedure(s) Performed: COLONOSCOPY WITH PROPOFOL ESOPHAGOGASTRODUODENOSCOPY (EGD) WITH PROPOFOL BALLOON DILATION  Patient Location: Endoscopy Unit  Anesthesia Type:General  Level of Consciousness: awake  Airway & Oxygen Therapy: Patient Spontanous Breathing  Post-op Assessment: Report given to RN and Post -op Vital signs reviewed and stable  Post vital signs: Reviewed and stable  Last Vitals:  Vitals Value Taken Time  BP    Temp    Pulse    Resp    SpO2      Last Pain:  Vitals:   05/19/21 0819  TempSrc:   PainSc: 0-No pain         Complications: No notable events documented.

## 2021-05-24 ENCOUNTER — Encounter (HOSPITAL_COMMUNITY): Payer: Self-pay | Admitting: Internal Medicine

## 2021-06-01 ENCOUNTER — Other Ambulatory Visit: Payer: Self-pay | Admitting: Cardiology

## 2021-06-01 DIAGNOSIS — I493 Ventricular premature depolarization: Secondary | ICD-10-CM

## 2021-06-01 DIAGNOSIS — R002 Palpitations: Secondary | ICD-10-CM

## 2021-06-02 ENCOUNTER — Telehealth: Payer: Self-pay

## 2021-06-02 DIAGNOSIS — R002 Palpitations: Secondary | ICD-10-CM

## 2021-06-02 DIAGNOSIS — I493 Ventricular premature depolarization: Secondary | ICD-10-CM

## 2021-06-02 MED ORDER — METOPROLOL SUCCINATE ER 50 MG PO TB24: ORAL_TABLET | ORAL | 2 refills | Status: DC

## 2021-06-02 NOTE — Telephone Encounter (Signed)
Pt came in to ask for a refill of metoprolol succinate (TOPROL-XL) 50 MG 24 hr. Pt states that he did not want to go back for a f/u appt with cardiologist and they refused the refill until he would come in the office for a visit. Pt states that he is completely out of this med. Please advise. ? ?Please call pt back: [phone] ? ?

## 2021-06-02 NOTE — Telephone Encounter (Signed)
Rx sent to pharmacy   

## 2021-06-05 ENCOUNTER — Telehealth: Payer: Self-pay | Admitting: Family Medicine

## 2021-06-05 ENCOUNTER — Other Ambulatory Visit: Payer: Self-pay

## 2021-06-05 DIAGNOSIS — I493 Ventricular premature depolarization: Secondary | ICD-10-CM

## 2021-06-05 DIAGNOSIS — R002 Palpitations: Secondary | ICD-10-CM

## 2021-06-05 MED ORDER — METOPROLOL SUCCINATE ER 50 MG PO TB24: 50.0000 mg | ORAL_TABLET | Freq: Every day | ORAL | 2 refills | Status: DC

## 2021-06-05 NOTE — Telephone Encounter (Signed)
Rx with clarification sent to pharmacy.  ?

## 2021-06-05 NOTE — Telephone Encounter (Signed)
Received fax from [place] to request clarification of dose instructions (how often) for Rx of metoprolol succinate (TOPROL-XL) 50 MG 24 hr tablet [[id number]] ? ?Please advise pharmacist at [phone] ? ?

## 2021-06-14 ENCOUNTER — Telehealth: Payer: Self-pay | Admitting: Pharmacist

## 2021-06-14 NOTE — Progress Notes (Signed)
? ? ?Chronic Care Management ?Pharmacy Assistant  ? ?Name: James Harper  MRN: [id number] DOB: Apr 14, 1945 ? ? ?Reason for Encounter: Disease State - Diabetes Call  ?  ? ? ?Recent office visits:  ?None noted.  ? ?Recent consult visits:  ?None noted. ? ?Hospital visits: 05/19/21 ?Medication Reconciliation was completed by comparing discharge summary, patient?s EMR and Pharmacy list, and upon discussion with patient. ? ?Admitted to the hospital on 05/19/21 due to Colonoscopy. Discharge date was 05/19/21. Discharged from [facility].   ? ?New?Medications Started at [facility] Discharge:?? ?omeprazole ([place]) ? ?Medication Changes at Hospital Discharge: ?None noted.  ? ?Medications Discontinued at Hospital Discharge: ?Na Sulfate-K Sulfate-Mg Sulf 17.5-3.13-1.6 GM/177ML Soln (Suprep Bowel Prep Kit) ? ?Medications that remain the same after Hospital Discharge:??  ?All other medications will remain the same.   ? ? ?Medications: ?Outpatient Encounter Medications as of 06/14/2021  ?Medication Sig  ? aspirin 81 MG chewable tablet Chew 1 tablet (81 mg total) by mouth daily.  ? Barberry-Oreg Grape-Goldenseal (BERBERINE COMPLEX PO) Take 1 capsule by mouth 2 (two) times daily before a meal. Breakfast and Dinner  ? hydrochlorothiazide (HYDRODIURIL) 12.5 MG tablet TAKE ONE TABLET (12.5MG TOTAL) BY MOUTH DAILY  ? losartan (COZAAR) 100 MG tablet TAKE ONE TABLET (100MG TOTAL) BY MOUTH DAILY  ? metFORMIN (GLUCOPHAGE) 500 MG tablet TAKE ONE TABLET (500MG TOTAL) BY MOUTH TWO TIMES DAILY WITH A MEAL  ? metoprolol succinate (TOPROL-XL) 50 MG 24 hr tablet Take 1 tablet (50 mg total) by mouth daily. Take with or immediately following a meal.  ? omeprazole (PRILOSEC) 20 MG capsule Take 1 capsule (20 mg total) by mouth 2 (two) times daily before a meal. Take 30 min before breakfast and 30 min before dinner  ? oxymetazoline (AFRIN) 0.05 % nasal spray Place 1 spray into both nostrils 2 (two) times daily as needed for congestion.  ? ?No  facility-administered encounter medications on file as of 06/14/2021.  ? ? ?Current antihyperglycemic regimen:  ?Metformin 534m BID with meals ?  ?  ?What recent interventions/DTPs have been made to improve glycemic control:  ?Patient reported no changes in medication regimen.  ?  ?Have there been any recent hospitalizations or ED visits since last visit with [facility]?  ?Patient has had no hospitalizations or ED visits since last visit with [facility].  ?  ?Patient denies hypoglycemic symptoms, including Pale, Sweaty, Shaky, Hungry, Nervous/irritable, and Vision changes ?  ?  ?Patient denies hyperglycemic symptoms, including blurry vision, excessive thirst, fatigue, polyuria, and weakness ?  ?  ?How often are you checking your blood sugar? ?Patient reported checking blood sugars daily every am. ?             ?What are your blood sugars ranging?  ?Fasting: 130-140 ?Before meals:  ?After meals:  ?Bedtime:  ?  ?During the week, how often does your blood glucose drop below 70? ? Patient reported no readings below 70 ?  ?Are you checking your feet daily/regularly? ?Patient reported he does check his feet regularly and currently has no concerns.  ?             ?  ?Adherence Review: ?Is the patient currently on a STATIN medication? No ?Is the patient currently on ACE/ARB medication? Yes ?Does the patient have >5 day gap between last estimated fill dates? No ?  ?  ?Care Gaps ?  ?AWV: done 12/11/20 ?Colonoscopy: done 05/19/21 ?DM Eye Exam: N/A ?DM Foot Exam:N/A ?Microalbumin: done 09/08/20 ?HbgAIC: done 09/08/20 (  6.2) ?DEXA: N/A ?Mammogram: N/A ? ?  ?Star Rating Drugs: ?losartan (COZAAR) 100 MG tablet - last filled 05/15/21 90 days  ?metFORMIN (GLUCOPHAGE) 500 MG tablet - last filled 05/01/21 90 days  ? ? ?No future appointments. ? ? ?Liza Showfety, CCMA ?Clinical Pharmacist Assistant  ?[phone] ? ? ?

## 2021-07-31 ENCOUNTER — Telehealth: Payer: Self-pay | Admitting: Family Medicine

## 2021-08-01 NOTE — Telephone Encounter (Signed)
Called pt to make appointment. Pt will call back to make appointment when he has his schedule available. ?Pt is concerned that medication won't be refilled.  ?

## 2021-08-01 NOTE — Telephone Encounter (Signed)
Patient has made an appt for Friday, 08/11/2021 at 8:00 he is taken his last metformin this evening. Can we please send in refill for 30 day he will come to his appointment on Friday. ? ?CB# [phone] ?

## 2021-08-01 NOTE — Telephone Encounter (Signed)
Already refilled today

## 2021-08-01 NOTE — Telephone Encounter (Signed)
Requested medications are due for refill today.  yes ? ?Requested medications are on the active medications list.  yes ? ?Last refill. 12/19/2020 #180 1 refill ? ?Future visit scheduled.   no ? ?Notes to clinic.  Medication refill failed protocol due to expired/abnormal labs. Called James Harper to make appt. James Harper is out of this medication and needs refill today. ? ? ? ?Requested Prescriptions  ?Pending Prescriptions Disp Refills  ? metFORMIN (GLUCOPHAGE) 500 MG tablet [Pharmacy Med Name: METFORMIN HCL 500 MG TAB] 180 tablet 1  ?  Sig: TAKE ONE TABLET (500MG TOTAL) BY MOUTH TWO TIMES DAILY WITH A MEAL  ?  ? Endocrinology:  Diabetes - Biguanides Failed - 07/31/2021  9:23 AM  ?  ?  Failed - Cr in normal range and within 360 days  ?  Creat  ?Date Value Ref Range Status  ?09/08/2020 1.00 0.70 - 1.18 mg/dL Final  ?  Comment:  ?  For patients >23 years of age, the reference limit ?for Creatinine is approximately 13% higher for people ?identified as African-American. ?. ?  ? ?Creatinine, Ser  ?Date Value Ref Range Status  ?05/18/2021 1.25 (H) 0.61 - 1.24 mg/dL Final  ?  ?  ?  ?  Failed - HBA1C is between 0 and 7.9 and within 180 days  ?  Hgb A1c MFr Bld  ?Date Value Ref Range Status  ?09/08/2020 6.2 (H) <5.7 % of total Hgb Final  ?  Comment:  ?  For someone without known diabetes, a hemoglobin  ?A1c value between 5.7% and 6.4% is consistent with ?prediabetes and should be confirmed with a  ?follow-up test. ?. ?For someone with known diabetes, a value <7% ?indicates that their diabetes is well controlled. A1c ?targets should be individualized based on duration of ?diabetes, age, comorbid conditions, and other ?considerations. ?. ?This assay result is consistent with an increased risk ?of diabetes. ?. ?Currently, no consensus exists regarding use of ?hemoglobin A1c for diagnosis of diabetes for children. ?. ?  ?  ?  ?  ?  Failed - eGFR in normal range and within 360 days  ?  GFR, Est African American  ?Date Value Ref Range Status   ?09/08/2020 85 > OR = 60 mL/min/1.50m Final  ? ?GFR, Est Non African American  ?Date Value Ref Range Status  ?09/08/2020 73 > OR = 60 mL/min/1.754mFinal  ? ?GFR, Estimated  ?Date Value Ref Range Status  ?05/18/2021 60 (L) >60 mL/min Final  ?  Comment:  ?  (NOTE) ?Calculated using the CKD-EPI Creatinine Equation (2021) ?  ?  ?  ?  ?  Failed - B12 Level in normal range and within 720 days  ?  No results found for: VITAMINB12  ?  ?  ?  Failed - Valid encounter within last 6 months  ?  Recent Outpatient Visits   ? ?      ? 7 months ago Encounter for Me[employer] Metals Companynnual wellness exam  ? [facility] PiSusy FrizzleMD  ? 10 months ago Benign essential HTN  ? [facility] [place], WaCammie McgeeMD  ? 1 year ago Uncontrolled diabetes mellitus with stage 2 chronic kidney disease, without long-term current use of insulin (HCSan [place] ? [facility] [place], WaCammie McgeeMD  ? 2 years ago Uncontrolled diabetes mellitus with stage 2 chronic kidney disease, without long-term current use of insulin ([place] ? BrStanfordickard, WaCammie McgeeMD  ?  4 years ago Prediabetes  ? [facility] [place], Cammie Mcgee, MD  ? ?  ?  ? ? ?  ?  ?  Passed - CBC within normal limits and completed in the last 12 months  ?  WBC  ?Date Value Ref Range Status  ?09/08/2020 9.2 3.8 - 10.8 Thousand/uL Final  ? ?RBC  ?Date Value Ref Range Status  ?09/08/2020 5.27 4.20 - 5.80 Million/uL Final  ? ?Hemoglobin  ?Date Value Ref Range Status  ?09/08/2020 15.0 13.2 - 17.1 g/dL Final  ? ?HCT  ?Date Value Ref Range Status  ?09/08/2020 46.2 38.5 - 50.0 % Final  ? ?MCHC  ?Date Value Ref Range Status  ?09/08/2020 32.5 32.0 - 36.0 g/dL Final  ? ?MCH  ?Date Value Ref Range Status  ?09/08/2020 28.5 27.0 - 33.0 pg Final  ? ?MCV  ?Date Value Ref Range Status  ?09/08/2020 87.7 80.0 - 100.0 fL Final  ? ?No results found for: PLTCOUNTKUC, LABPLAT, [place] ?RDW  ?Date Value Ref Range Status  ?09/08/2020 13.0 11.0  - 15.0 % Final  ? ?  ?  ?  ?  ?

## 2021-08-07 ENCOUNTER — Encounter: Payer: Self-pay | Admitting: Internal Medicine

## 2021-08-11 ENCOUNTER — Ambulatory Visit (INDEPENDENT_AMBULATORY_CARE_PROVIDER_SITE_OTHER): Payer: PPO | Admitting: Family Medicine

## 2021-08-11 VITALS — BP 132/72 | HR 68 | Temp 98.3°F | Ht 69.0 in | Wt 197.8 lb

## 2021-08-11 DIAGNOSIS — Z23 Encounter for immunization: Secondary | ICD-10-CM

## 2021-08-11 DIAGNOSIS — E7800 Pure hypercholesterolemia, unspecified: Secondary | ICD-10-CM | POA: Diagnosis not present

## 2021-08-11 DIAGNOSIS — E119 Type 2 diabetes mellitus without complications: Secondary | ICD-10-CM | POA: Diagnosis not present

## 2021-08-11 DIAGNOSIS — T466X5A Adverse effect of antihyperlipidemic and antiarteriosclerotic drugs, initial encounter: Secondary | ICD-10-CM

## 2021-08-11 DIAGNOSIS — G720 Drug-induced myopathy: Secondary | ICD-10-CM

## 2021-08-11 DIAGNOSIS — H40033 Anatomical narrow angle, bilateral: Secondary | ICD-10-CM | POA: Diagnosis not present

## 2021-08-11 DIAGNOSIS — E118 Type 2 diabetes mellitus with unspecified complications: Secondary | ICD-10-CM | POA: Diagnosis not present

## 2021-08-11 DIAGNOSIS — Z125 Encounter for screening for malignant neoplasm of prostate: Secondary | ICD-10-CM | POA: Diagnosis not present

## 2021-08-11 DIAGNOSIS — I10 Essential (primary) hypertension: Secondary | ICD-10-CM

## 2021-08-11 NOTE — Progress Notes (Signed)
Subjective:    Patient ID: James Harper, male    DOB: [birth year], 76 y.o.   MRN: [id number]  HPI Patient is a very pleasant 76 year old Caucasian gentleman who presents today for follow-up of his chronic health conditions.  He has a history of hypertension however his blood pressure today is outstanding at 132/72.  He is consistently taking his metoprolol, hydrochlorothiazide, and losartan.  He does occasionally get dizzy but he denies any hypotension or bradycardia when he is dizzy.  He also has a history of diabetes however his last A1c a year ago was outstanding.  He is consistently taking his metformin.  He does complain of some numbness and tingling on the plantar aspects of both feet however it is not severe.  He denies any neuropathic pain.  He has excellent pulses at the dorsalis pedis and posterior tibialis.  He denies any claudication.  He is due for Prevnar 20.  He is also due for a PSA. Past Medical History:  Diagnosis Date   GERD (gastroesophageal reflux disease)    Hep C w/o coma, chronic (HCC)    Hypertension    PONV (postoperative nausea and vomiting)    Prediabetes    Past Surgical History:  Procedure Laterality Date   BACK SURGERY  1980   BALLOON DILATION N/A 05/19/2021   Procedure: BALLOON DILATION;  Surgeon: Eloise Harman, DO;  [facility];  Service: Endoscopy;  Laterality: N/A;   COLONOSCOPY WITH PROPOFOL N/A 05/19/2021   Procedure: COLONOSCOPY WITH PROPOFOL;  Surgeon: Eloise Harman, DO;  [facility]: [facility];  Service: Endoscopy;  Laterality: N/A;  8:00am   ESOPHAGOGASTRODUODENOSCOPY (EGD) WITH PROPOFOL N/A 05/19/2021   Procedure: ESOPHAGOGASTRODUODENOSCOPY (EGD) WITH PROPOFOL;  Surgeon: Eloise Harman, DO;  [facility];  Service: Endoscopy;  Laterality: N/A;   Current Outpatient Medications on File Prior to Visit  Medication Sig Dispense Refill   aspirin 81 MG chewable tablet Chew 1 tablet (81 mg total) by mouth daily. 90 tablet 0    Barberry-Oreg Grape-Goldenseal (BERBERINE COMPLEX PO) Take 1 capsule by mouth 2 (two) times daily before a meal. Breakfast and Dinner     hydrochlorothiazide (HYDRODIURIL) 12.5 MG tablet TAKE ONE TABLET (12.'5MG'$  TOTAL) BY MOUTH DAILY 90 tablet 3   losartan (COZAAR) 100 MG tablet TAKE ONE TABLET ('100MG'$  TOTAL) BY MOUTH DAILY 90 tablet 3   metFORMIN (GLUCOPHAGE) 500 MG tablet TAKE ONE TABLET ('500MG'$  TOTAL) BY MOUTH TWO TIMES DAILY WITH A MEAL 180 tablet 1   metoprolol succinate (TOPROL-XL) 50 MG 24 hr tablet Take 1 tablet (50 mg total) by mouth daily. Take with or immediately following a meal. 90 tablet 2   omeprazole (PRILOSEC) 20 MG capsule Take 1 capsule (20 mg total) by mouth 2 (two) times daily before a meal. Take 30 min before breakfast and 30 min before dinner 60 capsule 5   oxymetazoline (AFRIN) 0.05 % nasal spray Place 1 spray into both nostrils 2 (two) times daily as needed for congestion.     No current facility-administered medications on file prior to visit.   Allergies  Allergen Reactions   Omnipaque [Iohexol] Nausea And Vomiting    Pt states he has severe vomiting with IV contrast, especially the older kind he had over 20 yrs ago.  He had it one other time, it was injected slower and he said he did better.   Norvasc [Amlodipine Besylate] Swelling   Social History   Socioeconomic History   Marital status:  Married    Spouse name: Not on file   Number of children: 2   Years of education: Not on file   Highest education level: Not on file  Occupational History   Not on file  Tobacco Use   Smoking status: Former    Packs/day: 0.25    Years: 15.00    Pack years: 3.75    Types: Cigarettes    Quit date: 08/19/1972    Years since quitting: 49.0   Smokeless tobacco: Never  Vaping Use   Vaping Use: Never used  Substance and Sexual Activity   Alcohol use: Yes    Comment: rare beer   Drug use: Never   Sexual activity: Not on file  Other Topics Concern   Not on file  Social  History Narrative   Not on file   Social Determinants of Health   Financial Resource Strain: Low Risk    Difficulty of Paying Living Expenses: Not very hard  Food Insecurity: Not on file  Transportation Needs: Not on file  Physical Activity: Not on file  Stress: Not on file  Social Connections: Not on file  Intimate Partner Violence: Not on file    Review of Systems     Objective:   Physical Exam Vitals reviewed.  Constitutional:      General: He is not in acute distress.    Appearance: Normal appearance. He is not ill-appearing, toxic-appearing or diaphoretic.  Cardiovascular:     Rate and Rhythm: Normal rate and regular rhythm.     Pulses: Normal pulses.     Heart sounds: Normal heart sounds. No murmur heard.   No friction rub. No gallop.  Pulmonary:     Effort: Pulmonary effort is normal. No respiratory distress.     Breath sounds: Normal breath sounds. No stridor. No wheezing, rhonchi or rales.  Abdominal:     General: Abdomen is flat. Bowel sounds are normal. There is no distension.     Palpations: Abdomen is soft. There is no mass.     Tenderness: There is no abdominal tenderness. There is no guarding or rebound.     Hernia: No hernia is present.  Musculoskeletal:     Right lower leg: No edema.     Left lower leg: No edema.  Neurological:     Mental Status: He is alert.          Assessment & Plan:  Benign essential HTN  Pure hypercholesterolemia  Statin myopathy  Controlled diabetes mellitus type 2 with complications, unspecified whether long term insulin use (HCC) - Plan: Hemoglobin A1c, CBC with Differential/Platelet, Lipid panel, Microalbumin, urine, COMPLETE METABOLIC PANEL WITH GFR  Prostate cancer screening - Plan: PSA I am very happy with his blood pressure.  Check CBC CMP fasting lipid panel A1c and urine microalbumin.  I like to see his A1c below 6.5 and his albumin to creatinine ratio less than 30.  His goal LDL cholesterol is less than 100.   His blood pressure today is outstanding.  I will screen for prostate cancer with PSA.  He did receive Prevnar 20 today.  He is not on statin due to history of statin induced myopathy.

## 2021-08-11 NOTE — Addendum Note (Signed)
Addended by: Colman Cater on: 08/11/2021 08:31 AM   Modules accepted: Orders

## 2021-08-12 LAB — COMPLETE METABOLIC PANEL WITH GFR
AG Ratio: 1.7 (calc) (ref 1.0–2.5)
ALT: 16 U/L (ref 9–46)
AST: 17 U/L (ref 10–35)
Albumin: 4.5 g/dL (ref 3.6–5.1)
Alkaline phosphatase (APISO): 85 U/L (ref 35–144)
BUN: 23 mg/dL (ref 7–25)
CO2: 26 mmol/L (ref 20–32)
Calcium: 10.2 mg/dL (ref 8.6–10.3)
Chloride: 104 mmol/L (ref 98–110)
Creat: 1.22 mg/dL (ref 0.70–1.28)
Globulin: 2.6 g/dL (calc) (ref 1.9–3.7)
Glucose, Bld: 141 mg/dL — ABNORMAL HIGH (ref 65–99)
Potassium: 5.6 mmol/L — ABNORMAL HIGH (ref 3.5–5.3)
Sodium: 142 mmol/L (ref 135–146)
Total Bilirubin: 0.6 mg/dL (ref 0.2–1.2)
Total Protein: 7.1 g/dL (ref 6.1–8.1)
eGFR: 61 mL/min/{1.73_m2} (ref >=60)

## 2021-08-12 LAB — CBC WITH DIFFERENTIAL/PLATELET
Absolute Monocytes: 569 cells/uL (ref 200–950)
Basophils Absolute: 58 cells/uL (ref 0–200)
Basophils Relative: 0.8 %
Eosinophils Absolute: 173 cells/uL (ref 15–500)
Eosinophils Relative: 2.4 %
HCT: 47.3 % (ref 38.5–50.0)
Hemoglobin: 15.6 g/dL (ref 13.2–17.1)
Lymphs Abs: 1606 cells/uL (ref 850–3900)
MCH: 29.3 pg (ref 27.0–33.0)
MCHC: 33 g/dL (ref 32.0–36.0)
MCV: 88.7 fL (ref 80.0–100.0)
MPV: 11.5 fL (ref 7.5–12.5)
Monocytes Relative: 7.9 %
Neutro Abs: 4795 cells/uL (ref 1500–7800)
Neutrophils Relative %: 66.6 %
Platelets: 247 10*3/uL (ref 140–400)
RBC: 5.33 10*6/uL (ref 4.20–5.80)
RDW: 12.6 % (ref 11.0–15.0)
Total Lymphocyte: 22.3 %
WBC: 7.2 10*3/uL (ref 3.8–10.8)

## 2021-08-12 LAB — LIPID PANEL
Cholesterol: 140 mg/dL (ref <200)
HDL: 55 mg/dL (ref >=40)
LDL Cholesterol (Calc): 72 mg/dL (calc)
Non-HDL Cholesterol (Calc): 85 mg/dL (calc) (ref <130)
Total CHOL/HDL Ratio: 2.5 (calc) (ref <5.0)
Triglycerides: 58 mg/dL (ref <150)

## 2021-08-12 LAB — MICROALBUMIN, URINE: Microalb, Ur: 1.2 mg/dL

## 2021-08-12 LAB — PSA: PSA: 0.22 ng/mL (ref <=4.00)

## 2021-08-12 LAB — HEMOGLOBIN A1C
Hgb A1c MFr Bld: 6.2 % of total Hgb — ABNORMAL HIGH (ref <5.7)
Mean Plasma Glucose: 131 mg/dL
eAG (mmol/L): 7.3 mmol/L

## 2021-08-14 ENCOUNTER — Telehealth: Payer: Self-pay

## 2021-08-14 NOTE — Telephone Encounter (Signed)
I left a message for the patient to return my call.  Regarding lab results

## 2021-08-14 NOTE — Telephone Encounter (Signed)
I have attempted without success to contact this patient by phone to discuss lab results.

## 2021-08-14 NOTE — Telephone Encounter (Signed)
Patient returned your call. Please advise at [phone].

## 2021-08-14 NOTE — Telephone Encounter (Signed)
-----   Message from Susy Frizzle, MD sent at 08/14/2021  7:02 AM EDT ----- Labs look outstanding

## 2021-08-22 NOTE — Telephone Encounter (Signed)
Spoke with patient.

## 2021-08-31 ENCOUNTER — Telehealth: Payer: PPO

## 2022-01-10 ENCOUNTER — Telehealth: Payer: Self-pay | Admitting: Family Medicine

## 2022-01-10 NOTE — Telephone Encounter (Signed)
Called patient to schedule Medicare AWV. Patient declined; stated he doesn't think he needs it. Nothing further needed at this time.

## 2022-02-07 ENCOUNTER — Telehealth: Payer: Self-pay | Admitting: Family Medicine

## 2022-02-07 NOTE — Telephone Encounter (Signed)
Tried calling patient to schedule Medicare Annual Wellness Visit (AWV) either virtually or in office.   No answer    *due 03/27/2011 awvi per [facility] please schedule with Nurse Health Adviser   45 min for awv-i and in office appointments 30 min for awv-s  phone/virtual appointments

## 2022-02-13 ENCOUNTER — Other Ambulatory Visit: Payer: Self-pay | Admitting: Family Medicine

## 2022-02-19 ENCOUNTER — Other Ambulatory Visit: Payer: Self-pay

## 2022-02-19 ENCOUNTER — Telehealth: Payer: Self-pay

## 2022-02-19 DIAGNOSIS — I10 Essential (primary) hypertension: Secondary | ICD-10-CM

## 2022-02-19 MED ORDER — LOSARTAN POTASSIUM 100 MG PO TABS: ORAL_TABLET | ORAL | 3 refills | Status: DC

## 2022-02-19 NOTE — Telephone Encounter (Signed)
  Prescription Request  02/19/2022  Is this a "Controlled Substance" medicine? No  LOV: 02/13/2022   What is the name of the medication or equipment? losartan (COZAAR) 100 MG tablet [[id number]]    Have you contacted your pharmacy to request a refill? Yes   Which pharmacy would you like this sent to?  [facility], [place] [address] Phone: [phone] Fax: [phone]   Patient notified that their request is being sent to the clinical staff for review and that they should receive a response within 2 business days.   Please advise at [phone]

## 2022-02-26 ENCOUNTER — Other Ambulatory Visit: Payer: Self-pay | Admitting: Family Medicine

## 2022-02-26 DIAGNOSIS — I493 Ventricular premature depolarization: Secondary | ICD-10-CM

## 2022-02-26 DIAGNOSIS — R002 Palpitations: Secondary | ICD-10-CM

## 2022-03-12 ENCOUNTER — Other Ambulatory Visit: Payer: Self-pay | Admitting: Family Medicine

## 2022-03-12 DIAGNOSIS — I10 Essential (primary) hypertension: Secondary | ICD-10-CM

## 2022-03-20 ENCOUNTER — Ambulatory Visit (INDEPENDENT_AMBULATORY_CARE_PROVIDER_SITE_OTHER): Payer: PPO | Admitting: Family Medicine

## 2022-03-20 ENCOUNTER — Encounter: Payer: Self-pay | Admitting: Family Medicine

## 2022-03-20 VITALS — BP 138/72 | HR 74 | Temp 98.7°F | Ht 69.0 in | Wt 195.0 lb

## 2022-03-20 DIAGNOSIS — J069 Acute upper respiratory infection, unspecified: Secondary | ICD-10-CM

## 2022-03-20 DIAGNOSIS — R051 Acute cough: Secondary | ICD-10-CM

## 2022-03-20 MED ORDER — ALBUTEROL SULFATE HFA 108 (90 BASE) MCG/ACT IN AERS: 2.0000 | INHALATION_SPRAY | Freq: Four times a day (QID) | RESPIRATORY_TRACT | 0 refills | Status: DC | PRN

## 2022-03-20 MED ORDER — BENZONATATE 200 MG PO CAPS: 200.0000 mg | ORAL_CAPSULE | Freq: Two times a day (BID) | ORAL | 0 refills | Status: DC | PRN

## 2022-03-20 NOTE — Patient Instructions (Signed)

## 2022-03-20 NOTE — Progress Notes (Signed)
Acute Office Visit  Subjective:     Patient ID: James Harper, male    DOB: [birth year], 76 y.o.   MRN: [id number]  Chief Complaint  Patient presents with   Acute Visit    possible exposure to RSV, had a cough since Thursday, wife was positive for RSV    HPI Patient is in today for 7 days of severe cough with clear sputum, worse at night, with headache, clear rhinorrhea, sore throat, wheezing at night, nausea. Denies fever, shortness of breath, chest  pain, vomiting, diarrhea, body aches, chills. Exposed to wife who had RSV. Has tried nothing.  Review of Systems  All other systems reviewed and are negative.   Past Medical History:  Diagnosis Date   GERD (gastroesophageal reflux disease)    Hep C w/o coma, chronic (HCC)    Hypertension    PONV (postoperative nausea and vomiting)    Prediabetes    Past Surgical History:  Procedure Laterality Date   BACK SURGERY  1980   BALLOON DILATION N/A 05/19/2021   Procedure: BALLOON DILATION;  Surgeon: Eloise Harman, DO;  [facility];  Service: Endoscopy;  Laterality: N/A;   COLONOSCOPY WITH PROPOFOL N/A 05/19/2021   Procedure: COLONOSCOPY WITH PROPOFOL;  Surgeon: Eloise Harman, DO;  [facility];  Service: Endoscopy;  Laterality: N/A;  8:00am   ESOPHAGOGASTRODUODENOSCOPY (EGD) WITH PROPOFOL N/A 05/19/2021   Procedure: ESOPHAGOGASTRODUODENOSCOPY (EGD) WITH PROPOFOL;  Surgeon: Eloise Harman, DO;  [facility];  Service: Endoscopy;  Laterality: N/A;   Current Outpatient Medications on File Prior to Visit  Medication Sig Dispense Refill   aspirin 81 MG chewable tablet Chew 1 tablet (81 mg total) by mouth daily. 90 tablet 0   Barberry-Oreg Grape-Goldenseal (BERBERINE COMPLEX PO) Take 1 capsule by mouth 2 (two) times daily before a meal. Breakfast and Dinner     hydrochlorothiazide (HYDRODIURIL) 12.5 MG tablet TAKE ONE TABLET (12.'5MG'$  TOTAL) BY MOUTH DAILY 90 tablet 3   losartan (COZAAR) 100 MG  tablet TAKE ONE TABLET ('100MG'$  TOTAL) BY MOUTH DAILY 90 tablet 3   metFORMIN (GLUCOPHAGE) 500 MG tablet TAKE ONE TABLET ('500MG'$  TOTAL) BY MOUTH TWO TIMES DAILY WITH A MEAL 180 tablet 1   metoprolol succinate (TOPROL-XL) 50 MG 24 hr tablet TAKE ONE TABLET ('50MG'$  TOTAL) BY MOUTH DAILY. TAKE WITH OR IMMEDIATELY FOLLOWING A MEAL. 90 tablet 2   omeprazole (PRILOSEC) 20 MG capsule Take 1 capsule (20 mg total) by mouth 2 (two) times daily before a meal. Take 30 min before breakfast and 30 min before dinner (Patient taking differently: Take 20 mg by mouth daily. Take 30 min before breakfast and 30 min before dinner) 60 capsule 5   oxymetazoline (AFRIN) 0.05 % nasal spray Place 1 spray into both nostrils 2 (two) times daily as needed for congestion.     No current facility-administered medications on file prior to visit.   Allergies  Allergen Reactions   Omnipaque [Iohexol] Nausea And Vomiting    Pt states he has severe vomiting with IV contrast, especially the older kind he had over 20 yrs ago.  He had it one other time, it was injected slower and he said he did better.   Norvasc [Amlodipine Besylate] Swelling       Objective:    BP 138/72   Pulse 74   Temp 98.7 F (37.1 C) (Oral)   Ht '5\' 9"'$  (1.753 m)   Wt 195 lb (88.5 kg)   SpO2  94%   BMI 28.80 kg/m    Physical Exam Vitals and nursing note reviewed.  Constitutional:      Appearance: Normal appearance. He is normal weight.  HENT:     Head: Normocephalic and atraumatic.     Right Ear: Tympanic membrane, ear canal and external ear normal.     Left Ear: Tympanic membrane, ear canal and external ear normal.     Nose: Nose normal.     Mouth/Throat:     Mouth: Mucous membranes are moist.     Pharynx: Oropharynx is clear.  Eyes:     Conjunctiva/sclera: Conjunctivae normal.  Cardiovascular:     Rate and Rhythm: Normal rate and regular rhythm.     Pulses: Normal pulses.     Heart sounds: Normal heart sounds.  Pulmonary:     Effort:  Pulmonary effort is normal.     Breath sounds: Normal breath sounds.  Lymphadenopathy:     Cervical: No cervical adenopathy.  Skin:    General: Skin is warm and dry.     Capillary Refill: Capillary refill takes less than 2 seconds.  Neurological:     General: No focal deficit present.     Mental Status: He is alert and oriented to person, place, and time. Mental status is at baseline.  Psychiatric:        Mood and Affect: Mood normal.        Behavior: Behavior normal.        Thought Content: Thought content normal.        Judgment: Judgment normal.     No results found for any visits on 03/20/22.      Assessment & Plan:   Problem List Items Addressed This Visit       Respiratory   Viral upper respiratory tract infection - Primary    Reassured patient that symptoms and exam findings are most consistent with a viral upper respiratory infection and explained lack of efficacy of antibiotics against viruses.  Discussed expected course and features suggestive of secondary bacterial infection.  Continue supportive care. Increase fluid intake with water or electrolyte solution like pedialyte. Encouraged acetaminophen as needed for fever/pain. Encouraged salt water gargling, chloraseptic spray and throat lozenges. Encouraged OTC guaifenesin. Encouraged saline sinus flushes and/or neti with humidified air.  Start Tessalon for cough and Albuterol for wheezing. Return to office if symptoms persist or worsen.      Other Visit Diagnoses     Acute cough       Relevant Medications   benzonatate (TESSALON) 200 MG capsule       Meds ordered this encounter  Medications   benzonatate (TESSALON) 200 MG capsule    Sig: Take 1 capsule (200 mg total) by mouth 2 (two) times daily as needed for cough.    Dispense:  20 capsule    Refill:  0    Order Specific Question:   Supervising Provider    Answer:   Jenna Luo T [[id number]]   albuterol (VENTOLIN HFA) 108 (90 Base) MCG/ACT inhaler    Sig:  Inhale 2 puffs into the lungs every 6 (six) hours as needed for wheezing or shortness of breath.    Dispense:  8 g    Refill:  0    Order Specific Question:   Supervising Provider    Answer:   Jenna Luo T [[id number]]    Return if symptoms worsen or fail to improve.  Rubie Maid, FNP

## 2022-03-20 NOTE — Assessment & Plan Note (Signed)
Reassured patient that symptoms and exam findings are most consistent with a viral upper respiratory infection and explained lack of efficacy of antibiotics against viruses.  Discussed expected course and features suggestive of secondary bacterial infection.  Continue supportive care. Increase fluid intake with water or electrolyte solution like pedialyte. Encouraged acetaminophen as needed for fever/pain. Encouraged salt water gargling, chloraseptic spray and throat lozenges. Encouraged OTC guaifenesin. Encouraged saline sinus flushes and/or neti with humidified air.  Start Tessalon for cough and Albuterol for wheezing. Return to office if symptoms persist or worsen.

## 2022-05-14 NOTE — Patient Instructions (Incomplete)
Mr. James Harper , Thank you for taking time to come for your Medicare Wellness Visit. I appreciate your ongoing commitment to your health goals. Please review the following plan we discussed and let me know if I can assist you in the future.   These are the goals we discussed:  Goals   None     This is a list of the screening recommended for you and due dates:  Health Maintenance  Topic Date Due   Yearly kidney health urinalysis for diabetes  Never done   DTaP/Tdap/Td vaccine (1 - Tdap) Never done   Zoster (Shingles) Vaccine (1 of 2) Never done   Flu Shot  10/24/2021   COVID-19 Vaccine (3 - 2023-24 season) 11/24/2021   Medicare Annual Wellness Visit  12/11/2021   Yearly kidney function blood test for diabetes  08/12/2022   Pneumonia Vaccine  Completed   Hepatitis C Screening: USPSTF Recommendation to screen - Ages 18-79 yo.  Completed   HPV Vaccine  Aged Out   Colon Cancer Screening  Discontinued    Advanced directives: Advance directive discussed with you today. I have provided a copy for you to complete at home and have notarized. Once this is complete please bring a copy in to our office so we can scan it into your chart.   Conditions/risks identified: Aim for 30 minutes of exercise or brisk walking, 6-8 glasses of water, and 5 servings of fruits and vegetables each day.   Next appointment: Follow up in one year for your annual wellness visit.   Preventive Care 80 Years and Older, Male  Preventive care refers to lifestyle choices and visits with your health care provider that can promote health and wellness. What does preventive care include? A yearly physical exam. This is also called an annual well check. Dental exams once or twice a year. Routine eye exams. Ask your health care provider how often you should have your eyes checked. Personal lifestyle choices, including: Daily care of your teeth and gums. Regular physical activity. Eating a healthy diet. Avoiding tobacco and  drug use. Limiting alcohol use. Practicing safe sex. Taking low doses of aspirin every day. Taking vitamin and mineral supplements as recommended by your health care provider. What happens during an annual well check? The services and screenings done by your health care provider during your annual well check will depend on your age, overall health, lifestyle risk factors, and family history of disease. Counseling  Your health care provider may ask you questions about your: Alcohol use. Tobacco use. Drug use. Emotional well-being. Home and relationship well-being. Sexual activity. Eating habits. History of falls. Memory and ability to understand (cognition). Work and work Statistician. Screening  You may have the following tests or measurements: Height, weight, and BMI. Blood pressure. Lipid and cholesterol levels. These may be checked every 5 years, or more frequently if you are over 86 years old. Skin check. Lung cancer screening. You may have this screening every year starting at age 59 if you have a 30-pack-year history of smoking and currently smoke or have quit within the past 15 years. Fecal occult blood test (FOBT) of the stool. You may have this test every year starting at age 75. Flexible sigmoidoscopy or colonoscopy. You may have a sigmoidoscopy every 5 years or a colonoscopy every 10 years starting at age 90. Prostate cancer screening. Recommendations will vary depending on your family history and other risks. Hepatitis C blood test. Hepatitis B blood test. Sexually transmitted disease (STD) testing.  Diabetes screening. This is done by checking your blood sugar (glucose) after you have not eaten for a while (fasting). You may have this done every 1-3 years. Abdominal aortic aneurysm (AAA) screening. You may need this if you are a current or former smoker. Osteoporosis. You may be screened starting at age 52 if you are at high risk. Talk with your health care provider  about your test results, treatment options, and if necessary, the need for more tests. Vaccines  Your health care provider may recommend certain vaccines, such as: Influenza vaccine. This is recommended every year. Tetanus, diphtheria, and acellular pertussis (Tdap, Td) vaccine. You may need a Td booster every 10 years. Zoster vaccine. You may need this after age 37. Pneumococcal 13-valent conjugate (PCV13) vaccine. One dose is recommended after age 70. Pneumococcal polysaccharide (PPSV23) vaccine. One dose is recommended after age 30. Talk to your health care provider about which screenings and vaccines you need and how often you need them. This information is not intended to replace advice given to you by your health care provider. Make sure you discuss any questions you have with your health care provider. Document Released: 04/08/2015 Document Revised: 11/30/2015 Document Reviewed: 01/11/2015 Elsevier Interactive Patient Education  2017 [place] Prevention in the Home Falls can cause injuries. They can happen to people of all ages. There are many things you can do to make your home safe and to help prevent falls. What can I do on the outside of my home? Regularly fix the edges of walkways and driveways and fix any cracks. Remove anything that might make you trip as you walk through a door, such as a raised step or threshold. Trim any bushes or trees on the path to your home. Use bright outdoor lighting. Clear any walking paths of anything that might make someone trip, such as rocks or tools. Regularly check to see if handrails are loose or broken. Make sure that both sides of any steps have handrails. Any raised decks and porches should have guardrails on the edges. Have any leaves, snow, or ice cleared regularly. Use sand or salt on walking paths during winter. Clean up any spills in your garage right away. This includes oil or grease spills. What can I do in the  bathroom? Use night lights. Install grab bars by the toilet and in the tub and shower. Do not use towel bars as grab bars. Use non-skid mats or decals in the tub or shower. If you need to sit down in the shower, use a plastic, non-slip stool. Keep the floor dry. Clean up any water that spills on the floor as soon as it happens. Remove soap buildup in the tub or shower regularly. Attach bath mats securely with double-sided non-slip rug tape. Do not have throw rugs and other things on the floor that can make you trip. What can I do in the bedroom? Use night lights. Make sure that you have a light by your bed that is easy to reach. Do not use any sheets or blankets that are too big for your bed. They should not hang down onto the floor. Have a firm chair that has side arms. You can use this for support while you get dressed. Do not have throw rugs and other things on the floor that can make you trip. What can I do in the kitchen? Clean up any spills right away. Avoid walking on wet floors. Keep items that you use a lot in easy-to-reach  places. If you need to reach something above you, use a strong step stool that has a grab bar. Keep electrical cords out of the way. Do not use floor polish or wax that makes floors slippery. If you must use wax, use non-skid floor wax. Do not have throw rugs and other things on the floor that can make you trip. What can I do with my stairs? Do not leave any items on the stairs. Make sure that there are handrails on both sides of the stairs and use them. Fix handrails that are broken or loose. Make sure that handrails are as long as the stairways. Check any carpeting to make sure that it is firmly attached to the stairs. Fix any carpet that is loose or worn. Avoid having throw rugs at the top or bottom of the stairs. If you do have throw rugs, attach them to the floor with carpet tape. Make sure that you have a light switch at the top of the stairs and the  bottom of the stairs. If you do not have them, ask someone to add them for you. What else can I do to help prevent falls? Wear shoes that: Do not have high heels. Have rubber bottoms. Are comfortable and fit you well. Are closed at the toe. Do not wear sandals. If you use a stepladder: Make sure that it is fully opened. Do not climb a closed stepladder. Make sure that both sides of the stepladder are locked into place. Ask someone to hold it for you, if possible. Clearly mark and make sure that you can see: Any grab bars or handrails. First and last steps. Where the edge of each step is. Use tools that help you move around (mobility aids) if they are needed. These include: Canes. Walkers. Scooters. Crutches. Turn on the lights when you go into a dark area. Replace any light bulbs as soon as they burn out. Set up your furniture so you have a clear path. Avoid moving your furniture around. If any of your floors are uneven, fix them. If there are any pets around you, be aware of where they are. Review your medicines with your doctor. Some medicines can make you feel dizzy. This can increase your chance of falling. Ask your doctor what other things that you can do to help prevent falls. This information is not intended to replace advice given to you by your health care provider. Make sure you discuss any questions you have with your health care provider. Document Released: 01/06/2009 Document Revised: 08/18/2015 Document Reviewed: 04/16/2014 Elsevier Interactive Patient Education  2017 [employer].

## 2022-05-14 NOTE — Progress Notes (Unsigned)
Subjective:   James Harper is a 77 y.o. male who presents for Medicare Annual/Subsequent preventive examination.  Review of Systems    ***       Objective:    There were no vitals filed for this visit. There is no height or weight on file to calculate BMI.     12/11/2020    9:02 AM  Advanced Directives  Does Patient Have a Medical Advance Directive? No  Would patient like information on creating a medical advance directive? No - Patient declined    Current Medications (verified) Outpatient Encounter Medications as of 05/15/2022  Medication Sig   albuterol (VENTOLIN HFA) 108 (90 Base) MCG/ACT inhaler Inhale 2 puffs into the lungs every 6 (six) hours as needed for wheezing or shortness of breath.   aspirin 81 MG chewable tablet Chew 1 tablet (81 mg total) by mouth daily.   Barberry-Oreg Grape-Goldenseal (BERBERINE COMPLEX PO) Take 1 capsule by mouth 2 (two) times daily before a meal. Breakfast and Dinner   benzonatate (TESSALON) 200 MG capsule Take 1 capsule (200 mg total) by mouth 2 (two) times daily as needed for cough.   hydrochlorothiazide (HYDRODIURIL) 12.5 MG tablet TAKE ONE TABLET (12.5MG TOTAL) BY MOUTH DAILY   losartan (COZAAR) 100 MG tablet TAKE ONE TABLET (100MG TOTAL) BY MOUTH DAILY   metFORMIN (GLUCOPHAGE) 500 MG tablet TAKE ONE TABLET (500MG TOTAL) BY MOUTH TWO TIMES DAILY WITH A MEAL   metoprolol succinate (TOPROL-XL) 50 MG 24 hr tablet TAKE ONE TABLET (50MG TOTAL) BY MOUTH DAILY. TAKE WITH OR IMMEDIATELY FOLLOWING A MEAL.   omeprazole (PRILOSEC) 20 MG capsule Take 1 capsule (20 mg total) by mouth 2 (two) times daily before a meal. Take 30 min before breakfast and 30 min before dinner (Patient taking differently: Take 20 mg by mouth daily. Take 30 min before breakfast and 30 min before dinner)   oxymetazoline (AFRIN) 0.05 % nasal spray Place 1 spray into both nostrils 2 (two) times daily as needed for congestion.   No facility-administered encounter medications on  file as of 05/15/2022.    Allergies (verified) Omnipaque [iohexol] and Norvasc [amlodipine besylate]   History: Past Medical History:  Diagnosis Date   GERD (gastroesophageal reflux disease)    Hep C w/o coma, chronic (HCC)    Hypertension    PONV (postoperative nausea and vomiting)    Prediabetes    Past Surgical History:  Procedure Laterality Date   BACK SURGERY  1980   BALLOON DILATION N/A 05/19/2021   Procedure: BALLOON DILATION;  Surgeon: Eloise Harman, DO;  [facility];  Service: Endoscopy;  Laterality: N/A;   COLONOSCOPY WITH PROPOFOL N/A 05/19/2021   Procedure: COLONOSCOPY WITH PROPOFOL;  Surgeon: Eloise Harman, DO;  [facility];  Service: Endoscopy;  Laterality: N/A;  8:00am   ESOPHAGOGASTRODUODENOSCOPY (EGD) WITH PROPOFOL N/A 05/19/2021   Procedure: ESOPHAGOGASTRODUODENOSCOPY (EGD) WITH PROPOFOL;  Surgeon: Eloise Harman, DO;  [facility];  Service: Endoscopy;  Laterality: N/A;   Family History  Problem Relation Age of Onset   Heart attack Mother    Heart disease Mother    Heart disease Sister    Diabetes Sister    Heart disease Brother    Diabetes Brother    Colon cancer Neg Hx    Colon polyps Neg Hx    Social History   Socioeconomic History   Marital status: Married    Spouse name: Not on file   Number of children:  2   Years of education: Not on file   Highest education level: Not on file  Occupational History   Not on file  Tobacco Use   Smoking status: Former    Packs/day: 0.25    Years: 15.00    Total pack years: 3.75    Types: Cigarettes    Quit date: 08/19/1972    Years since quitting: 49.7   Smokeless tobacco: Never  Vaping Use   Vaping Use: Never used  Substance and Sexual Activity   Alcohol use: Yes    Comment: rare beer   Drug use: Never   Sexual activity: Not on file  Other Topics Concern   Not on file  Social History Narrative   Not on file   Social Determinants of Health   Financial  Resource Strain: Low Risk  (10/06/2020)   Overall Financial Resource Strain (CARDIA)    Difficulty of Paying Living Expenses: Not very hard  Food Insecurity: Not on file  Transportation Needs: Not on file  Physical Activity: Not on file  Stress: Not on file  Social Connections: Not on file    Tobacco Counseling Counseling given: Not Answered   Clinical Intake:                 Diabetic?No          Activities of Daily Living     No data to display          Patient Care Team: Susy Frizzle, MD as PCP - General (Family Medicine) Edythe Clarity, [facility] as Pharmacist (Pharmacist)  Indicate any recent Medical Services you may have received from other than [facility] providers in the past year (date may be approximate).     Assessment:   This is a routine wellness examination for James Harper.  Hearing/Vision screen No results found.  Dietary issues and exercise activities discussed:     Goals Addressed   None    Depression Screen    12/11/2020    9:06 AM 09/08/2020    8:15 AM 01/17/2017    8:09 AM 01/03/2016    9:07 AM 07/31/2013   10:51 AM  PHQ 2/9 Scores  PHQ - 2 Score 0 0 0 0 0    Fall Risk    12/11/2020    9:05 AM 09/08/2020    8:15 AM 10/22/2018    4:28 PM 01/17/2017    8:09 AM 07/31/2013   10:51 AM  [place] in the past year? 0 0 0 No No  Comment   Emmi Telephone Survey: data to providers prior to load    Number falls in past yr: 0 0     Injury with Fall? 0 0     Risk for fall due to : No Fall Risks No Fall Risks     Follow up Education provided;Falls prevention discussed;Falls evaluation completed Falls evaluation completed       FALL RISK PREVENTION PERTAINING TO THE HOME:  Any stairs in or around the home? {YES/NO:21197} If so, are there any without handrails? {YES/NO:21197} Home free of loose throw rugs in walkways, pet beds, electrical cords, etc? {YES/NO:21197} Adequate lighting in your home to reduce risk of falls?  {YES/NO:21197}  ASSISTIVE DEVICES UTILIZED TO PREVENT FALLS:  Life alert? {YES/NO:21197} Use of a cane, walker or w/c? {YES/NO:21197} Grab bars in the bathroom? {YES/NO:21197} Shower chair or bench in shower? {YES/NO:21197} Elevated toilet seat or a handicapped toilet? {YES/NO:21197}  TIMED UP AND GO:  Was  the test performed? {YES/NO:21197}.  Length of time to ambulate 10 feet: *** sec.   {Appearance of GA:4730917  Cognitive Function:        12/11/2020    9:03 AM  6CIT Screen  What Year? 0 points  What month? 0 points  What time? 0 points  Count back from 20 0 points  Months in reverse 0 points  Repeat phrase 0 points  Total Score 0 points    Immunizations Immunization History  Administered Date(s) Administered   Fluad Quad(high Dose 65+) 01/15/2019, 02/29/2020   Influenza,inj,Quad PF,6+ Mos 01/17/2017   Moderna Sars-Covid-2 Vaccination 11/05/2019, 12/03/2019   PNEUMOCOCCAL CONJUGATE-20 08/11/2021   Pneumococcal Polysaccharide-23 01/17/2017    {TDAP status:2101805}  {Flu Vaccine status:2101806}  Pneumococcal vaccine status: Up to date  Covid-19 vaccine status: Information provided on how to obtain vaccines.   Qualifies for Shingles Vaccine? Yes   Zostavax completed No   Shingrix Completed?: No.    Education has been provided regarding the importance of this vaccine. Patient has been advised to call insurance company to determine out of pocket expense if they have not yet received this vaccine. Advised may also receive vaccine at local pharmacy or Health Dept. Verbalized acceptance and understanding.  Screening Tests Health Maintenance  Topic Date Due   Diabetic kidney evaluation - Urine ACR  Never done   DTaP/Tdap/Td (1 - Tdap) Never done   Zoster Vaccines- Shingrix (1 of 2) Never done   INFLUENZA VACCINE  10/24/2021   COVID-19 Vaccine (3 - 2023-24 season) 11/24/2021   Medicare Annual Wellness (AWV)  12/11/2021   Diabetic kidney evaluation - eGFR  measurement  08/12/2022   Pneumonia Vaccine 31+ Years old  Completed   Hepatitis C Screening  Completed   HPV VACCINES  Aged Out   COLONOSCOPY (Pts 45-75yr Insurance coverage will need to be confirmed)  Discontinued    Health Maintenance  Health Maintenance Due  Topic Date Due   Diabetic kidney evaluation - Urine ACR  Never done   DTaP/Tdap/Td (1 - Tdap) Never done   Zoster Vaccines- Shingrix (1 of 2) Never done   INFLUENZA VACCINE  10/24/2021   COVID-19 Vaccine (3 - 2023-24 season) 11/24/2021   Medicare Annual Wellness (AWV)  12/11/2021    Colorectal cancer screening: No longer required.   Lung Cancer Screening: (Low Dose CT Chest recommended if Age 77-80years, 30 pack-year currently smoking OR have quit w/in 15years.) does not qualify.   Lung Cancer Screening Referral: n/a  Additional Screening:  Hepatitis C Screening: does qualify; Completed 02/29/20  Vision Screening: Recommended annual ophthalmology exams for early detection of glaucoma and other disorders of the eye. Is the patient up to date with their annual eye exam?  {YES/NO:21197} Who is the provider or what is the name of the office in which the patient attends annual eye exams? *** If pt is not established with a provider, would they like to be referred to a provider to establish care? {YES/NO:21197}.   Dental Screening: Recommended annual dental exams for proper oral hygiene  Community Resource Referral / Chronic Care Management: CRR required this visit?  {YES/NO:21197}  CCM required this visit?  {YES/NO:21197}     Plan:     I have personally reviewed and noted the following in the patient's chart:   Medical and social history Use of alcohol, tobacco or illicit drugs  Current medications and supplements including opioid prescriptions. {Opioid [phone]} Functional ability and status Nutritional status Physical activity Advanced directives List of  other physicians Hospitalizations,  surgeries, and ER visits in previous 12 months Vitals Screenings to include cognitive, depression, and falls Referrals and appointments  In addition, I have reviewed and discussed with patient certain preventive protocols, quality metrics, and best practice recommendations. A written personalized care plan for preventive services as well as general preventive health recommendations were provided to patient.     Denman George [place], [place]   [id number]   Nurse Notes: ***

## 2022-05-15 ENCOUNTER — Ambulatory Visit (INDEPENDENT_AMBULATORY_CARE_PROVIDER_SITE_OTHER): Payer: PPO

## 2022-05-15 VITALS — BP 136/70 | Ht 69.0 in | Wt 195.0 lb

## 2022-05-15 DIAGNOSIS — Z0000 Encounter for general adult medical examination without abnormal findings: Secondary | ICD-10-CM | POA: Diagnosis not present

## 2022-10-22 ENCOUNTER — Other Ambulatory Visit: Payer: Self-pay | Admitting: Family Medicine

## 2022-11-20 ENCOUNTER — Other Ambulatory Visit: Payer: Self-pay | Admitting: Family Medicine

## 2022-11-20 DIAGNOSIS — I493 Ventricular premature depolarization: Secondary | ICD-10-CM

## 2022-11-20 DIAGNOSIS — R002 Palpitations: Secondary | ICD-10-CM

## 2022-11-21 NOTE — Telephone Encounter (Signed)
OV needed for additional refills, 30 day supply given until OV can be made.  Requested Prescriptions  Pending Prescriptions Disp Refills   metoprolol succinate (TOPROL-XL) 50 MG 24 hr tablet [Pharmacy Med Name: METOPROLOL SUCCINATE ER 50 MG TAB] 30 tablet 0    Sig: TAKE ONE TABLET (50MG  TOTAL) BY MOUTH DAILY. TAKE WITH OR IMMEDIATELY FOLLOWING A MEAL.     Cardiovascular:  Beta Blockers Failed - 11/20/2022  9:15 AM      Failed - Valid encounter within last 6 months    Recent Outpatient Visits           1 year ago Benign essential HTN   [facility] Pickard, Priscille Heidelberg, MD   1 year ago Encounter for Medicare annual wellness exam   [facility] Donita Brooks, MD   2 years ago Benign essential HTN   [facility] Donita Brooks, MD   2 years ago Uncontrolled diabetes mellitus with stage 2 chronic kidney disease, without long-term current use of insulin (HCC)   [facility] Donita Brooks, MD   3 years ago Uncontrolled diabetes mellitus with stage 2 chronic kidney disease, without long-term current use of insulin (HCC)   [facility] Pickard, Priscille Heidelberg, MD              Passed - Last BP in normal range    BP Readings from Last 1 Encounters:  05/15/22 136/70         Passed - Last Heart Rate in normal range    Pulse Readings from Last 1 Encounters:  03/20/22 74

## 2022-12-20 ENCOUNTER — Encounter: Payer: Self-pay | Admitting: Family Medicine

## 2022-12-20 ENCOUNTER — Ambulatory Visit (INDEPENDENT_AMBULATORY_CARE_PROVIDER_SITE_OTHER): Payer: PPO | Admitting: Family Medicine

## 2022-12-20 VITALS — BP 120/72 | HR 80 | Temp 98.7°F | Ht 69.0 in | Wt 191.2 lb

## 2022-12-20 DIAGNOSIS — R1013 Epigastric pain: Secondary | ICD-10-CM

## 2022-12-20 DIAGNOSIS — R0789 Other chest pain: Secondary | ICD-10-CM | POA: Diagnosis not present

## 2022-12-20 LAB — CBC WITH DIFFERENTIAL/PLATELET
Absolute Monocytes: 631 cells/uL (ref 200–950)
Basophils Absolute: 49 cells/uL (ref 0–200)
Basophils Relative: 0.5 %
Eosinophils Absolute: 243 cells/uL (ref 15–500)
Eosinophils Relative: 2.5 %
HCT: 50.3 % — ABNORMAL HIGH (ref 38.5–50.0)
Hemoglobin: 16.3 g/dL (ref 13.2–17.1)
Lymphs Abs: 1542 cells/uL (ref 850–3900)
MCH: 29.5 pg (ref 27.0–33.0)
MCHC: 32.4 g/dL (ref 32.0–36.0)
MCV: 91.1 fL (ref 80.0–100.0)
MPV: 11.6 fL (ref 7.5–12.5)
Monocytes Relative: 6.5 %
Neutro Abs: 7236 cells/uL (ref 1500–7800)
Neutrophils Relative %: 74.6 %
Platelets: 266 10*3/uL (ref 140–400)
RBC: 5.52 10*6/uL (ref 4.20–5.80)
RDW: 12.6 % (ref 11.0–15.0)
Total Lymphocyte: 15.9 %
WBC: 9.7 10*3/uL (ref 3.8–10.8)

## 2022-12-20 MED ORDER — PANTOPRAZOLE SODIUM 40 MG PO TBEC: 40.0000 mg | DELAYED_RELEASE_TABLET | Freq: Two times a day (BID) | ORAL | 3 refills | Status: AC

## 2022-12-20 NOTE — Progress Notes (Signed)
Wt Readings from Last 3 Encounters:  12/20/22 191 lb 3.2 oz (86.7 kg)  05/15/22 195 lb (88.5 kg)  03/20/22 195 lb (88.5 kg)     Subjective:    Patient ID: James Harper, male    DOB: January 24, 1946, 77 y.o.   MRN: [id number]  Abdominal Pain  Patient is a 77 year old Caucasian gentleman who recently has been dealing with epigastric pain and bloating.  He points to an area just inferior to the xiphoid process.  He states that he has pain in that area that will occasionally radiate in a bandlike fashion around his entire upper abdomen.  I will also radiate into his back.  This seems to be worse with food.  Recently he was driving a tractor.  While driving, he developed intense pain and shortness of breath in that area.  When he stopped and rested the pain went away.  The pain radiated into his back.  He states that he gets pain frequently in the epigastric area in the upper right quadrant when he eats.  He also reports nausea and bloating.  He denies any melena or hematochezia or constipation.  He does have frequent diarrhea Past Medical History:  Diagnosis Date   GERD (gastroesophageal reflux disease)    Hep C w/o coma, chronic (HCC)    Hypertension    PONV (postoperative nausea and vomiting)    Prediabetes    Past Surgical History:  Procedure Laterality Date   BACK SURGERY  1980   BALLOON DILATION N/A 05/19/2021   Procedure: BALLOON DILATION;  Surgeon: Lanelle Bal, DO;  [facility];  Service: Endoscopy;  Laterality: N/A;   COLONOSCOPY WITH PROPOFOL N/A 05/19/2021   Procedure: COLONOSCOPY WITH PROPOFOL;  Surgeon: Lanelle Bal, DO;  [facility];  Service: Endoscopy;  Laterality: N/A;  8:00am   ESOPHAGOGASTRODUODENOSCOPY (EGD) WITH PROPOFOL N/A 05/19/2021   Procedure: ESOPHAGOGASTRODUODENOSCOPY (EGD) WITH PROPOFOL;  Surgeon: Lanelle Bal, DO;  [facility];  Service: Endoscopy;  Laterality: N/A;   Current Outpatient Medications on File Prior to  Visit  Medication Sig Dispense Refill   aspirin 81 MG chewable tablet Chew 1 tablet (81 mg total) by mouth daily. 90 tablet 0   hydrochlorothiazide (HYDRODIURIL) 12.5 MG tablet TAKE ONE TABLET (12.5MG  TOTAL) BY MOUTH DAILY 90 tablet 3   losartan (COZAAR) 100 MG tablet TAKE ONE TABLET (100MG  TOTAL) BY MOUTH DAILY 90 tablet 3   metFORMIN (GLUCOPHAGE) 500 MG tablet TAKE ONE TABLET (500MG  TOTAL) BY MOUTH TWO TIMES DAILY WITH A MEAL 180 tablet 1   metoprolol succinate (TOPROL-XL) 50 MG 24 hr tablet TAKE ONE TABLET (50MG  TOTAL) BY MOUTH DAILY. TAKE WITH OR IMMEDIATELY FOLLOWING A MEAL. 30 tablet 0   oxymetazoline (AFRIN) 0.05 % nasal spray Place 1 spray into both nostrils 2 (two) times daily as needed for congestion.     omeprazole (PRILOSEC) 20 MG capsule Take 1 capsule (20 mg total) by mouth 2 (two) times daily before a meal. Take 30 min before breakfast and 30 min before dinner (Patient taking differently: Take 20 mg by mouth daily. Take 30 min before breakfast and 30 min before dinner) 60 capsule 5   No current facility-administered medications on file prior to visit.   Allergies  Allergen Reactions   Omnipaque [Iohexol] Nausea And Vomiting    Pt states he has severe vomiting with IV contrast, especially the older kind he had over 20 yrs ago.  He had it one other time,  it was injected slower and he said he did better.   Norvasc [Amlodipine Besylate] Swelling   Social History   Socioeconomic History   Marital status: Married    Spouse name: Not on file   Number of children: 2   Years of education: Not on file   Highest education level: Not on file  Occupational History   Not on file  Tobacco Use   Smoking status: Former    Current packs/day: 0.00    Average packs/day: 0.3 packs/day for 15.0 years (3.8 ttl pk-yrs)    Types: Cigarettes    Start date: 08/19/1957    Quit date: 08/19/1972    Years since quitting: 50.3   Smokeless tobacco: Never  Vaping Use   Vaping status: Never Used   Substance and Sexual Activity   Alcohol use: Yes    Comment: rare beer   Drug use: Never   Sexual activity: Not on file  Other Topics Concern   Not on file  Social History Narrative   Not on file   Social Determinants of Health   Financial Resource Strain: Low Risk  (05/15/2022)   Overall Financial Resource Strain (CARDIA)    Difficulty of Paying Living Expenses: Not hard at all  Food Insecurity: No Food Insecurity (05/15/2022)   Hunger Vital Sign    Worried About Running Out of Food in the Last Year: Never true    Ran Out of Food in the Last Year: Never true  Transportation Needs: No Transportation Needs (05/15/2022)   PRAPARE - Administrator, Civil Service (Medical): No    Lack of Transportation (Non-Medical): No  Physical Activity: Sufficiently Active (05/15/2022)   Exercise Vital Sign    Days of Exercise per Week: 5 days    Minutes of Exercise per Session: 30 min  Stress: No Stress Concern Present (05/15/2022)   [employer] of Occupational Health - Occupational Stress Questionnaire    Feeling of Stress : Not at all  Social Connections: Socially Integrated (05/15/2022)   Social Connection and Isolation Panel [NHANES]    Frequency of Communication with Friends and Family: More than three times a week    Frequency of Social Gatherings with Friends and Family: Three times a week    Attends Religious Services: More than 4 times per year    Active Member of Clubs or Organizations: Yes    Attends Banker Meetings: More than 4 times per year    Marital Status: Married  Catering manager Violence: Not At Risk (05/15/2022)   Humiliation, Afraid, Rape, and Kick questionnaire    Fear of Current or Ex-Partner: No    Emotionally Abused: No    Physically Abused: No    Sexually Abused: No    Review of Systems  Gastrointestinal:  Positive for abdominal pain.       Objective:   Physical Exam Vitals reviewed.  Constitutional:      General: He is not in  acute distress.    Appearance: Normal appearance. He is not ill-appearing, toxic-appearing or diaphoretic.  Cardiovascular:     Rate and Rhythm: Normal rate and regular rhythm.     Pulses: Normal pulses.     Heart sounds: Normal heart sounds. No murmur heard.    No friction rub. No gallop.  Pulmonary:     Effort: Pulmonary effort is normal. No respiratory distress.     Breath sounds: Normal breath sounds. No stridor. No wheezing, rhonchi or rales.  Abdominal:  General: Abdomen is flat. Bowel sounds are normal. There is no distension.     Palpations: Abdomen is soft. There is no mass.     Tenderness: There is no abdominal tenderness. There is no guarding or rebound.     Hernia: No hernia is present.    Musculoskeletal:     Right lower leg: No edema.     Left lower leg: No edema.  Neurological:     Mental Status: He is alert.           Assessment & Plan:  Epigastric pain - Plan: COMPLETE METABOLIC PANEL WITH GFR, CBC with Differential/Platelet, Lipase, US Abdomen Complete, EKG 12-Lead I am unable to reproduce the pain on exam today.  The patient has symptoms that could represent peptic ulcer disease, pancreatitis, biliary tract disease, or less likely atypical angina.  Recommended an EKG of the heart.  Obtain an ultrasound of the abdomen to evaluate for gallstones.  Check CBC CMP and a lipase.  Start the patient on Protonix 40 mg twice daily while awaiting the results of the above workup.  EKG shows no evidence of ischemia today.  It does show possible Q waves in lead III and aVF.  This is a change compared to 2021.  Therefore I am going to get a cardiac CT to evaluate for any significant coronary artery disease.

## 2022-12-21 LAB — COMPLETE METABOLIC PANEL WITH GFR
AG Ratio: 1.5 (calc) (ref 1.0–2.5)
ALT: 15 U/L (ref 9–46)
AST: 16 U/L (ref 10–35)
Albumin: 4.6 g/dL (ref 3.6–5.1)
Alkaline phosphatase (APISO): 68 U/L (ref 35–144)
BUN: 22 mg/dL (ref 7–25)
CO2: 29 mmol/L (ref 20–32)
Calcium: 10.5 mg/dL — ABNORMAL HIGH (ref 8.6–10.3)
Chloride: 99 mmol/L (ref 98–110)
Creat: 1.23 mg/dL (ref 0.70–1.28)
Globulin: 3 g/dL (ref 1.9–3.7)
Glucose, Bld: 141 mg/dL — ABNORMAL HIGH (ref 65–99)
Potassium: 5.2 mmol/L (ref 3.5–5.3)
Sodium: 137 mmol/L (ref 135–146)
Total Bilirubin: 1 mg/dL (ref 0.2–1.2)
Total Protein: 7.6 g/dL (ref 6.1–8.1)
eGFR: 60 mL/min/{1.73_m2} (ref >=60)

## 2022-12-21 LAB — LIPASE: Lipase: 14 U/L (ref 7–60)

## 2022-12-24 ENCOUNTER — Other Ambulatory Visit: Payer: Self-pay | Admitting: Family Medicine

## 2022-12-24 ENCOUNTER — Ambulatory Visit (HOSPITAL_COMMUNITY)
Admission: RE | Admit: 2022-12-24 | Discharge: 2022-12-24 | Disposition: A | Discharge status: Home / Self Care | Payer: PPO | Source: Ambulatory Visit | Attending: Family Medicine | Admitting: Family Medicine

## 2022-12-24 DIAGNOSIS — R1013 Epigastric pain: Secondary | ICD-10-CM | POA: Diagnosis not present

## 2022-12-24 DIAGNOSIS — I493 Ventricular premature depolarization: Secondary | ICD-10-CM

## 2022-12-24 DIAGNOSIS — R002 Palpitations: Secondary | ICD-10-CM

## 2022-12-24 DIAGNOSIS — N200 Calculus of kidney: Secondary | ICD-10-CM | POA: Diagnosis not present

## 2022-12-24 DIAGNOSIS — N281 Cyst of kidney, acquired: Secondary | ICD-10-CM | POA: Diagnosis not present

## 2022-12-25 ENCOUNTER — Encounter: Payer: Self-pay | Admitting: Family Medicine

## 2022-12-25 NOTE — Telephone Encounter (Signed)
Requested Prescriptions  Pending Prescriptions Disp Refills   metoprolol succinate (TOPROL-XL) 50 MG 24 hr tablet [Pharmacy Med Name: METOPROLOL SUCCINATE ER 50 MG TAB] 90 tablet 1    Sig: TAKE ONE TABLET (50MG  TOTAL) BY MOUTH DAILY. TAKE WITH OR IMMEDIATELY FOLLOWING A MEAL.     Cardiovascular:  Beta Blockers Failed - 12/24/2022  9:40 AM      Failed - Valid encounter within last 6 months    Recent Outpatient Visits           1 year ago Benign essential HTN   [facility] Pickard, Priscille Heidelberg, MD   2 years ago Encounter for Medicare annual wellness exam   [facility] Donita Brooks, MD   2 years ago Benign essential HTN   [facility] Donita Brooks, MD   2 years ago Uncontrolled diabetes mellitus with stage 2 chronic kidney disease, without long-term current use of insulin (HCC)   [facility] Donita Brooks, MD   3 years ago Uncontrolled diabetes mellitus with stage 2 chronic kidney disease, without long-term current use of insulin (HCC)   [facility] Pickard, Priscille Heidelberg, MD              Passed - Last BP in normal range    BP Readings from Last 1 Encounters:  12/20/22 120/72         Passed - Last Heart Rate in normal range    Pulse Readings from Last 1 Encounters:  12/20/22 80

## 2023-01-24 ENCOUNTER — Ambulatory Visit (HOSPITAL_COMMUNITY)
Admission: RE | Admit: 2023-01-24 | Discharge: 2023-01-24 | Disposition: A | Discharge status: Home / Self Care | Payer: PPO | Source: Ambulatory Visit | Attending: Family Medicine | Admitting: Family Medicine

## 2023-01-24 DIAGNOSIS — R0789 Other chest pain: Secondary | ICD-10-CM | POA: Insufficient documentation

## 2023-01-25 ENCOUNTER — Encounter: Payer: Self-pay | Admitting: Family Medicine

## 2023-01-25 ENCOUNTER — Other Ambulatory Visit: Payer: Self-pay

## 2023-01-25 DIAGNOSIS — R0789 Other chest pain: Secondary | ICD-10-CM

## 2023-01-25 DIAGNOSIS — R931 Abnormal findings on diagnostic imaging of heart and coronary circulation: Secondary | ICD-10-CM

## 2023-01-25 DIAGNOSIS — R1013 Epigastric pain: Secondary | ICD-10-CM

## 2023-01-29 ENCOUNTER — Ambulatory Visit: Payer: PPO | Attending: Cardiovascular Disease | Admitting: Cardiovascular Disease

## 2023-01-29 ENCOUNTER — Encounter: Payer: Self-pay | Admitting: Cardiovascular Disease

## 2023-01-29 VITALS — BP 137/60 | HR 73 | Ht 69.0 in | Wt 190.0 lb

## 2023-01-29 DIAGNOSIS — I5032 Chronic diastolic (congestive) heart failure: Secondary | ICD-10-CM

## 2023-01-29 DIAGNOSIS — I700 Atherosclerosis of aorta: Secondary | ICD-10-CM | POA: Diagnosis not present

## 2023-01-29 DIAGNOSIS — I10 Essential (primary) hypertension: Secondary | ICD-10-CM | POA: Diagnosis not present

## 2023-01-29 DIAGNOSIS — E785 Hyperlipidemia, unspecified: Secondary | ICD-10-CM

## 2023-01-29 DIAGNOSIS — E1169 Type 2 diabetes mellitus with other specified complication: Secondary | ICD-10-CM

## 2023-01-29 DIAGNOSIS — Z01812 Encounter for preprocedural laboratory examination: Secondary | ICD-10-CM | POA: Diagnosis not present

## 2023-01-29 DIAGNOSIS — I25118 Atherosclerotic heart disease of native coronary artery with other forms of angina pectoris: Secondary | ICD-10-CM

## 2023-01-29 NOTE — Progress Notes (Signed)
Cardiology Office Note:    Date:  01/29/2023   ID:  James Harper, DOB December 09, 1945, MRN [id number]  PCP:  Donita Brooks, MD   [facility] Providers Cardiologist:  None     Referring MD: Donita Brooks, MD   Chief Complaint  Patient presents with   Shortness of Breath    History of Present Illness:    James Harper is a 76 y.o. male with a hx of HTN, GERD (previous esophageal dilation), borderline diabetes mellitus, aortic atherosclerosis who recently underwent a coronary calcium score that was severely elevated (1439, 82nd percentile).  He has been having exertional dyspnea and exertional chest tightness.  He lives independently and still works on a farm.  He is experience problems of shortness of breath where he has to clear brush from his property and has to stop and rest about every 20 minutes.  On 1 occasion when he was driving his tractor on muddy soil and had to pull harder than usual on the steering wheel he developed shortness of breath as well as chest tightness.  He describes this with a gripping motion using both hands over his chest.  With rest the symptoms subsided in a matter of a few minutes.  Symptoms have happened with slowly increasing frequency over the last year.  He had a particularly severe episode of shortness of breath and chest discomfort following exertion a couple of weeks ago, where he felt he might pass out, but only recently shared these complaints with his daughter (she is a Engineer, civil (consulting)).  Overall seems to have NYHA functional class II status.  He does not have shortness of breath or chest pain with personal care activities around the house.  He has not experienced full-blown syncope and is not aware of any palpitations.  He denies lower extremity edema, orthopnea or PND.  He has not had any focal neurological events.  He denies bleeding problems.  He has a very strong family history of premature CAD.  He has a brother who had his first bypass  surgery in his 83s and then had to undergo redo bypass surgery.  He has a sister had a myocardial infarction in her 74s.  His mother died of a myocardial infarction at age 56.  His blood pressure is well-controlled on a combination of losartan and hydrochlorothiazide.  His recent hemoglobin A1c was 6.5% and he has started treatment with metformin.  He has normal renal function and borderline hypercalcemia.  His most recent lipid profile from a year ago showed a good HDL cholesterol 55 and a pretty good LDL cholesterol at 72, without taking lipid-lowering medications.  He has been advised to take aspirin 81 mg daily but has not been taking this recently.  He was never a heavy smoker and he stopped smoking 50 years ago.  He is ECG today shows sinus rhythm with Q waves in leads V1 and V2.  I am not sure if the changes due to lead placement or true interval septal infarction, but he had normal septal R waves on ECG from 2021.  In 2021 he also underwent echocardiogram and nuclear stress test at [facility].  The studies were performed for "frequent PVCs".  The ejection fraction was 50-55% on the echocardiogram, 52% on the nuclear stress test.  There was evidence of aortic valve sclerosis, mild aortic insufficiency, mild mitral insufficiency.  Exercise time on the treadmill was very short (5 minutes, 7 METS), but no ischemic ECG changes were  seen.  Perfusion was described as normal.  Past Medical History:  Diagnosis Date   GERD (gastroesophageal reflux disease)    Hep C w/o coma, chronic (HCC)    Hypertension    PONV (postoperative nausea and vomiting)    Prediabetes     Past Surgical History:  Procedure Laterality Date   BACK SURGERY  1980   BALLOON DILATION N/A 05/19/2021   Procedure: BALLOON DILATION;  Surgeon: Lanelle Bal, DO;  [facility];  Service: Endoscopy;  Laterality: N/A;   COLONOSCOPY WITH PROPOFOL N/A 05/19/2021   Procedure: COLONOSCOPY WITH PROPOFOL;   Surgeon: Lanelle Bal, DO;  [facility];  Service: Endoscopy;  Laterality: N/A;  8:00am   ESOPHAGOGASTRODUODENOSCOPY (EGD) WITH PROPOFOL N/A 05/19/2021   Procedure: ESOPHAGOGASTRODUODENOSCOPY (EGD) WITH PROPOFOL;  Surgeon: Lanelle Bal, DO;  [facility];  Service: Endoscopy;  Laterality: N/A;    Current Medications: Current Meds  Medication Sig   hydrochlorothiazide (HYDRODIURIL) 12.5 MG tablet TAKE ONE TABLET (12.5MG  TOTAL) BY MOUTH DAILY   losartan (COZAAR) 100 MG tablet TAKE ONE TABLET (100MG  TOTAL) BY MOUTH DAILY   metFORMIN (GLUCOPHAGE) 500 MG tablet TAKE ONE TABLET (500MG  TOTAL) BY MOUTH TWO TIMES DAILY WITH A MEAL   metoprolol succinate (TOPROL-XL) 50 MG 24 hr tablet TAKE ONE TABLET (50MG  TOTAL) BY MOUTH DAILY. TAKE WITH OR IMMEDIATELY FOLLOWING A MEAL.     Allergies:   Omnipaque [iohexol] and Norvasc [amlodipine besylate]   Social History   Socioeconomic History   Marital status: Married    Spouse name: Not on file   Number of children: 2   Years of education: Not on file   Highest education level: Not on file  Occupational History   Not on file  Tobacco Use   Smoking status: Former    Current packs/day: 0.00    Average packs/day: 0.3 packs/day for 15.0 years (3.8 ttl pk-yrs)    Types: Cigarettes    Start date: 08/19/1957    Quit date: 08/19/1972    Years since quitting: 50.4   Smokeless tobacco: Never  Vaping Use   Vaping status: Never Used  Substance and Sexual Activity   Alcohol use: Yes    Comment: rare beer   Drug use: Never   Sexual activity: Not on file  Other Topics Concern   Not on file  Social History Narrative   Not on file   Social Determinants of Health   Financial Resource Strain: Low Risk  (05/15/2022)   Overall Financial Resource Strain (CARDIA)    Difficulty of Paying Living Expenses: Not hard at all  Food Insecurity: No Food Insecurity (05/15/2022)   Hunger Vital Sign    Worried About Running Out of Food in  the Last Year: Never true    Ran Out of Food in the Last Year: Never true  Transportation Needs: No Transportation Needs (05/15/2022)   PRAPARE - Administrator, Civil Service (Medical): No    Lack of Transportation (Non-Medical): No  Physical Activity: Sufficiently Active (05/15/2022)   Exercise Vital Sign    Days of Exercise per Week: 5 days    Minutes of Exercise per Session: 30 min  Stress: No Stress Concern Present (05/15/2022)   [employer] of Occupational Health - Occupational Stress Questionnaire    Feeling of Stress : Not at all  Social Connections: Socially Integrated (05/15/2022)   Social Connection and Isolation Panel [NHANES]    Frequency of Communication with Friends and  Family: More than three times a week    Frequency of Social Gatherings with Friends and Family: Three times a week    Attends Religious Services: More than 4 times per year    Active Member of Clubs or Organizations: Yes    Attends Engineer, structural: More than 4 times per year    Marital Status: Married     Family History: The patient's family history includes Diabetes in his brother and sister; Heart attack in his mother; Heart disease in his brother, mother, and sister. There is no history of Colon cancer or Colon polyps.  ROS:   Please see the history of present illness.     All other systems reviewed and are negative.  EKGs/Labs/Other Studies Reviewed:    The following studies were reviewed today:  EKG Interpretation Date/Time:  Tuesday January 29 2023 12:59:17 EST Ventricular Rate:  64 PR Interval:  202 QRS Duration:  82 QT Interval:  378 QTC Calculation: 389 R Axis:   1  Text Interpretation: Normal sinus rhythm Septal infarct , age undetermined When compared with ECG of 19-Jan-2005 19:20, Septal infarct is now Present Confirmed by Thurmon Fair [phone]) on 01/29/2023 4:20:29 PM    Recent Labs: 12/20/2022: ALT 15; BUN 22; Creat 1.23; Hemoglobin 16.3; Platelets  266; Potassium 5.2; Sodium 137  Recent Lipid Panel    Component Value Date/Time   CHOL 140 08/11/2021 0816   TRIG 58 08/11/2021 0816   HDL 55 08/11/2021 0816   CHOLHDL 2.5 08/11/2021 0816   VLDL 14 12/27/2015 0818   LDLCALC 72 08/11/2021 0816     Risk Assessment/Calculations:                Physical Exam:    VS:  BP 137/60   Pulse 73   Ht 5\' 9"  (1.753 m)   Wt 190 lb (86.2 kg)   SpO2 97%   BMI 28.06 kg/m     Wt Readings from Last 3 Encounters:  01/29/23 190 lb (86.2 kg)  12/20/22 191 lb 3.2 oz (86.7 kg)  05/15/22 195 lb (88.5 kg)     GEN:  Well nourished, well developed in no acute distress HEENT: Normal NECK: No JVD; No carotid bruits LYMPHATICS: No lymphadenopathy CARDIAC: RRR, no murmurs, rubs, gallops RESPIRATORY:  Clear to auscultation without rales, wheezing or rhonchi  ABDOMEN: Soft, non-tender, non-distended MUSCULOSKELETAL:  No edema; No deformity  SKIN: Warm and dry NEUROLOGIC:  Alert and oriented x 3 PSYCHIATRIC:  Normal affect   ASSESSMENT:    1. Chest pain of uncertain etiology   2. Hyperlipidemia, unspecified hyperlipidemia type   3. Pre-procedure lab exam    PLAN:    In order of problems listed above:  CAD: He describes symptoms consistent with CCS class II exertional angina pectoris with recent gradual exacerbation.  He had a "normal" nuclear perfusion study 3 years ago, but exercise capacity was low (5 minutes) and the ejection fraction was borderline at about 50%.  I am concerned that he may have "balanced ischemia" due to multivessel CAD and this is supported by the severely elevated coronary calcium score.  The heavy calcification would likely lead to difficulty in interpreting a coronary CT angiogram and I have recommended proceeding with invasive coronary angiography.  We discussed the possibility that he has multivessel CAD that would be best amenable to bypass surgery but that there is a chance that we would find the dominant severe  lesion that could be treated with angioplasty/stent. This procedure  has been fully reviewed with the patient and and his daughter and informed consent has been obtained.  Scheduled for next Tuesday with Dr. Clifton James.  Hold metformin, ARB, HCTZ on the day of the procedure.  Start taking aspirin 81 mg daily today.  Note that his LDL cholesterol is pretty good at 72, but either way I think we will recommend lipid-lowering therapy, even if we do not find severe coronary stenoses. CHF: He is describing symptoms of exertional dyspnea that may be due to multivessel CAD.  Reevaluate LVEF, modality depending on the findings at angiography. DM: Borderline, hemoglobin A1c 6.5% before starting medical therapy with metformin.  Will get an updated lipid profile today. Aortic atherosclerosis: With normal caliber aorta. HTN: BP was a little high today, but he was nervous.  May need to adjust the choice of antihypertensive medications depending on the findings of cardiac catheterization.      Informed Consent   Shared Decision Making/Informed Consent The risks [stroke (1 in 1000), death (1 in 1000), kidney failure [usually temporary] (1 in 500), bleeding (1 in 200), allergic reaction [possibly serious] (1 in 200)], benefits (diagnostic support and management of coronary artery disease) and alternatives of a cardiac catheterization were discussed in detail with Mr. Cuffee and he is willing to proceed.       Medication Adjustments/Labs and Tests Ordered: Current medicines are reviewed at length with the patient today.  Concerns regarding medicines are outlined above.  Orders Placed This Encounter  Procedures   CBC   Basic metabolic panel   Lipid panel   EKG 12-Lead   No orders of the defined types were placed in this encounter.   Patient Instructions  Medication Instructions:  Start taking Aspirin 81 mg daily *If you need a refill on your cardiac medications before your next appointment, please call your  pharmacy*   Lab Work: CBC, BMP, Lipid- today If you have labs (blood work) drawn today and your tests are completely normal, you will receive your results only by: MyChart Message (if you have MyChart) OR A paper copy in the mail If you have any lab test that is abnormal or we need to change your treatment, we will call you to review the results.   Testing/Procedures:  [place] A DEPT OF [facility] AT [facility] [address] [place] [address] [place] [address] Dept: [phone] Loc: [phone]  MIHIR FLANIGAN  01/29/2023  You are scheduled for a Cardiac Catheterization on Tuesday, November 12 with Dr. Verne Carrow.  1. Please arrive at the [facility] (Main Entrance A) at [facility]: [address] [place], [place] [address] at 11:30 AM (This time is 2 hour(s) before your procedure to ensure your preparation). Free valet parking service is available. You will check in at ADMITTING. The support person will be asked to wait in the waiting room.  It is OK to have someone drop you off and come back when you are ready to be discharged.    Special note: Every effort is made to have your procedure done on time. Please understand that emergencies sometimes delay scheduled procedures.  2. Diet: Do not eat solid foods after midnight.  The patient may have clear liquids until 5am upon the day of the procedure.  3. Labs: You will need to have blood drawn on 01/29/23  4. Medication instructions in preparation for your procedure:   Contrast Allergy: History of Nausea and vomiting  DO  NOT TAKE METFORMIN, LOSARTAN, OR HYDROCHLOROTHIAZIDE ON THE MORNING OF THE PROCEDURE    On the morning of your procedure, take your Aspirin 81 mg and any morning medicines NOT listed above.  You may use sips of water.  5. Plan to go home the same day, you will only stay overnight if medically necessary. 6. Bring a current list of  your medications and current insurance cards. 7. You MUST have a responsible person to drive you home. 8. Someone MUST be with you the first 24 hours after you arrive home or your discharge will be delayed. 9. Please wear clothes that are easy to get on and off and wear slip-on shoes.  Thank you for allowing [place] to care for you!   -- [facility]    Follow-Up: At [facility], you and your health needs are our priority.  As part of our continuing mission to provide you with exceptional heart care, we have created designated Provider Care Teams.  These Care Teams include your primary Cardiologist (physician) and Advanced Practice Providers (APPs -  Physician Assistants and Nurse Practitioners) who all work together to provide you with the care you need, when you need it.  We recommend signing up for the patient portal called "MyChart".  Sign up information is provided on this After Visit Summary.  MyChart is used to connect with patients for Virtual Visits (Telemedicine).  Patients are able to view lab/test results, encounter notes, upcoming appointments, etc.  Non-urgent messages can be sent to your provider as well.   To learn more about what you can do with MyChart, go to [email or web address].    Your next appointment:   3-4  month(s)  Provider:   Dr Royann Shivers    Signed, Thurmon Fair, MD  01/29/2023 4:29 PM    [facility]

## 2023-01-29 NOTE — Patient Instructions (Signed)
Medication Instructions:  Start taking Aspirin 81 mg daily *If you need a refill on your cardiac medications before your next appointment, please call your pharmacy*   Lab Work: CBC, BMP, Lipid- today If you have labs (blood work) drawn today and your tests are completely normal, you will receive your results only by: MyChart Message (if you have MyChart) OR A paper copy in the mail If you have any lab test that is abnormal or we need to change your treatment, we will call you to review the results.   Testing/Procedures:  [place] [facility] AT [facility] [address] [place] [address] [place] [address] Dept: [phone] Loc: [phone]  James Harper  01/29/2023  You are scheduled for a Cardiac Catheterization on Tuesday, November 12 with Dr. Verne Carrow.  1. Please arrive at the [facility] (Main Entrance A) at [facility]: [address] [place], [place] [address] at 11:30 AM (This time is 2 hour(s) before your procedure to ensure your preparation). Free valet parking service is available. You will check in at ADMITTING. The support person will be asked to wait in the waiting room.  It is OK to have someone drop you off and come back when you are ready to be discharged.    Special note: Every effort is made to have your procedure done on time. Please understand that emergencies sometimes delay scheduled procedures.  2. Diet: Do not eat solid foods after midnight.  The patient may have clear liquids until 5am upon the day of the procedure.  3. Labs: You will need to have blood drawn on 01/29/23  4. Medication instructions in preparation for your procedure:   Contrast Allergy: History of Nausea and vomiting  DO NOT TAKE METFORMIN, LOSARTAN, OR HYDROCHLOROTHIAZIDE ON THE MORNING OF THE PROCEDURE    On the morning of your procedure, take your Aspirin 81 mg and any morning medicines NOT  listed above.  You may use sips of water.  5. Plan to go home the same day, you will only stay overnight if medically necessary. 6. Bring a current list of your medications and current insurance cards. 7. You MUST have a responsible person to drive you home. 8. Someone MUST be with you the first 24 hours after you arrive home or your discharge will be delayed. 9. Please wear clothes that are easy to get on and off and wear slip-on shoes.  Thank you for allowing [place] to care for you!   -- [place] [facility]    Follow-Up: At [facility], you and your health needs are our priority.  As part of our continuing mission to provide you with exceptional heart care, we have created designated Provider Care Teams.  These Care Teams include your primary Cardiologist (physician) and Advanced Practice Providers (APPs -  Physician Assistants and Nurse Practitioners) who all work together to provide you with the care you need, when you need it.  We recommend signing up for the patient portal called "MyChart".  Sign up information is provided on this After Visit Summary.  MyChart is used to connect with patients for Virtual Visits (Telemedicine).  Patients are able to view lab/test results, encounter notes, upcoming appointments, etc.  Non-urgent messages can be sent to your provider as well.   To learn more about what you can do with MyChart, go to [email or web address].    Your next appointment:   3-4  month(s)  Provider:   Dr Royann Shivers

## 2023-01-29 NOTE — H&P (View-Only) (Signed)
 Cardiology Office Note:    Date:  01/29/2023   ID:  James Harper, DOB December 09, 1945, MRN [id number]  PCP:  Donita Brooks, MD   [facility] Providers Cardiologist:  None     Referring MD: Donita Brooks, MD   Chief Complaint  Patient presents with   Shortness of Breath    History of Present Illness:    James Harper is a 77 y.o. male with a hx of HTN, GERD (previous esophageal dilation), borderline diabetes mellitus, aortic atherosclerosis who recently underwent a coronary calcium score that was severely elevated (1439, 82nd percentile).  He has been having exertional dyspnea and exertional chest tightness.  He lives independently and still works on a farm.  He is experience problems of shortness of breath where he has to clear brush from his property and has to stop and rest about every 20 minutes.  On 1 occasion when he was driving his tractor on muddy soil and had to pull harder than usual on the steering wheel he developed shortness of breath as well as chest tightness.  He describes this with a gripping motion using both hands over his chest.  With rest the symptoms subsided in a matter of a few minutes.  Symptoms have happened with slowly increasing frequency over the last year.  He had a particularly severe episode of shortness of breath and chest discomfort following exertion a couple of weeks ago, where he felt he might pass out, but only recently shared these complaints with his daughter (she is a Engineer, civil (consulting)).  Overall seems to have NYHA functional class II status.  He does not have shortness of breath or chest pain with personal care activities around the house.  He has not experienced full-blown syncope and is not aware of any palpitations.  He denies lower extremity edema, orthopnea or PND.  He has not had any focal neurological events.  He denies bleeding problems.  He has a very strong family history of premature CAD.  He has a brother who had his first bypass  surgery in his 83s and then had to undergo redo bypass surgery.  He has a sister had a myocardial infarction in her 74s.  His mother died of a myocardial infarction at age 56.  His blood pressure is well-controlled on a combination of losartan and hydrochlorothiazide.  His recent hemoglobin A1c was 6.5% and he has started treatment with metformin.  He has normal renal function and borderline hypercalcemia.  His most recent lipid profile from a year ago showed a good HDL cholesterol 55 and a pretty good LDL cholesterol at 72, without taking lipid-lowering medications.  He has been advised to take aspirin 81 mg daily but has not been taking this recently.  He was never a heavy smoker and he stopped smoking 50 years ago.  He is ECG today shows sinus rhythm with Q waves in leads V1 and V2.  I am not sure if the changes due to lead placement or true interval septal infarction, but he had normal septal R waves on ECG from 2021.  In 2021 he also underwent echocardiogram and nuclear stress test at [facility].  The studies were performed for "frequent PVCs".  The ejection fraction was 50-55% on the echocardiogram, 52% on the nuclear stress test.  There was evidence of aortic valve sclerosis, mild aortic insufficiency, mild mitral insufficiency.  Exercise time on the treadmill was very short (5 minutes, 7 METS), but no ischemic ECG changes were  seen.  Perfusion was described as normal.  Past Medical History:  Diagnosis Date   GERD (gastroesophageal reflux disease)    Hep C w/o coma, chronic (HCC)    Hypertension    PONV (postoperative nausea and vomiting)    Prediabetes     Past Surgical History:  Procedure Laterality Date   BACK SURGERY  1980   BALLOON DILATION N/A 05/19/2021   Procedure: BALLOON DILATION;  Surgeon: Lanelle Bal, DO;  [facility];  Service: Endoscopy;  Laterality: N/A;   COLONOSCOPY WITH PROPOFOL N/A 05/19/2021   Procedure: COLONOSCOPY WITH PROPOFOL;   Surgeon: Lanelle Bal, DO;  [facility];  Service: Endoscopy;  Laterality: N/A;  8:00am   ESOPHAGOGASTRODUODENOSCOPY (EGD) WITH PROPOFOL N/A 05/19/2021   Procedure: ESOPHAGOGASTRODUODENOSCOPY (EGD) WITH PROPOFOL;  Surgeon: Lanelle Bal, DO;  [facility];  Service: Endoscopy;  Laterality: N/A;    Current Medications: Current Meds  Medication Sig   hydrochlorothiazide (HYDRODIURIL) 12.5 MG tablet TAKE ONE TABLET (12.5MG  TOTAL) BY MOUTH DAILY   losartan (COZAAR) 100 MG tablet TAKE ONE TABLET (100MG  TOTAL) BY MOUTH DAILY   metFORMIN (GLUCOPHAGE) 500 MG tablet TAKE ONE TABLET (500MG  TOTAL) BY MOUTH TWO TIMES DAILY WITH A MEAL   metoprolol succinate (TOPROL-XL) 50 MG 24 hr tablet TAKE ONE TABLET (50MG  TOTAL) BY MOUTH DAILY. TAKE WITH OR IMMEDIATELY FOLLOWING A MEAL.     Allergies:   Omnipaque [iohexol] and Norvasc [amlodipine besylate]   Social History   Socioeconomic History   Marital status: Married    Spouse name: Not on file   Number of children: 2   Years of education: Not on file   Highest education level: Not on file  Occupational History   Not on file  Tobacco Use   Smoking status: Former    Current packs/day: 0.00    Average packs/day: 0.3 packs/day for 15.0 years (3.8 ttl pk-yrs)    Types: Cigarettes    Start date: 08/19/1957    Quit date: 08/19/1972    Years since quitting: 50.4   Smokeless tobacco: Never  Vaping Use   Vaping status: Never Used  Substance and Sexual Activity   Alcohol use: Yes    Comment: rare beer   Drug use: Never   Sexual activity: Not on file  Other Topics Concern   Not on file  Social History Narrative   Not on file   Social Determinants of Health   Financial Resource Strain: Low Risk  (05/15/2022)   Overall Financial Resource Strain (CARDIA)    Difficulty of Paying Living Expenses: Not hard at all  Food Insecurity: No Food Insecurity (05/15/2022)   Hunger Vital Sign    Worried About Running Out of Food in  the Last Year: Never true    Ran Out of Food in the Last Year: Never true  Transportation Needs: No Transportation Needs (05/15/2022)   PRAPARE - Administrator, Civil Service (Medical): No    Lack of Transportation (Non-Medical): No  Physical Activity: Sufficiently Active (05/15/2022)   Exercise Vital Sign    Days of Exercise per Week: 5 days    Minutes of Exercise per Session: 30 min  Stress: No Stress Concern Present (05/15/2022)   [employer] of Occupational Health - Occupational Stress Questionnaire    Feeling of Stress : Not at all  Social Connections: Socially Integrated (05/15/2022)   Social Connection and Isolation Panel [NHANES]    Frequency of Communication with Friends and  Family: More than three times a week    Frequency of Social Gatherings with Friends and Family: Three times a week    Attends Religious Services: More than 4 times per year    Active Member of Clubs or Organizations: Yes    Attends Engineer, structural: More than 4 times per year    Marital Status: Married     Family History: The patient's family history includes Diabetes in his brother and sister; Heart attack in his mother; Heart disease in his brother, mother, and sister. There is no history of Colon cancer or Colon polyps.  ROS:   Please see the history of present illness.     All other systems reviewed and are negative.  EKGs/Labs/Other Studies Reviewed:    The following studies were reviewed today:  EKG Interpretation Date/Time:  Tuesday January 29 2023 12:59:17 EST Ventricular Rate:  64 PR Interval:  202 QRS Duration:  82 QT Interval:  378 QTC Calculation: 389 R Axis:   1  Text Interpretation: Normal sinus rhythm Septal infarct , age undetermined When compared with ECG of 19-Jan-2005 19:20, Septal infarct is now Present Confirmed by Thurmon Fair [phone]) on 01/29/2023 4:20:29 PM    Recent Labs: 12/20/2022: ALT 15; BUN 22; Creat 1.23; Hemoglobin 16.3; Platelets  266; Potassium 5.2; Sodium 137  Recent Lipid Panel    Component Value Date/Time   CHOL 140 08/11/2021 0816   TRIG 58 08/11/2021 0816   HDL 55 08/11/2021 0816   CHOLHDL 2.5 08/11/2021 0816   VLDL 14 12/27/2015 0818   LDLCALC 72 08/11/2021 0816     Risk Assessment/Calculations:                Physical Exam:    VS:  BP 137/60   Pulse 73   Ht 5\' 9"  (1.753 m)   Wt 190 lb (86.2 kg)   SpO2 97%   BMI 28.06 kg/m     Wt Readings from Last 3 Encounters:  01/29/23 190 lb (86.2 kg)  12/20/22 191 lb 3.2 oz (86.7 kg)  05/15/22 195 lb (88.5 kg)     GEN:  Well nourished, well developed in no acute distress HEENT: Normal NECK: No JVD; No carotid bruits LYMPHATICS: No lymphadenopathy CARDIAC: RRR, no murmurs, rubs, gallops RESPIRATORY:  Clear to auscultation without rales, wheezing or rhonchi  ABDOMEN: Soft, non-tender, non-distended MUSCULOSKELETAL:  No edema; No deformity  SKIN: Warm and dry NEUROLOGIC:  Alert and oriented x 3 PSYCHIATRIC:  Normal affect   ASSESSMENT:    1. Chest pain of uncertain etiology   2. Hyperlipidemia, unspecified hyperlipidemia type   3. Pre-procedure lab exam    PLAN:    In order of problems listed above:  CAD: He describes symptoms consistent with CCS class II exertional angina pectoris with recent gradual exacerbation.  He had a "normal" nuclear perfusion study 3 years ago, but exercise capacity was low (5 minutes) and the ejection fraction was borderline at about 50%.  I am concerned that he may have "balanced ischemia" due to multivessel CAD and this is supported by the severely elevated coronary calcium score.  The heavy calcification would likely lead to difficulty in interpreting a coronary CT angiogram and I have recommended proceeding with invasive coronary angiography.  We discussed the possibility that he has multivessel CAD that would be best amenable to bypass surgery but that there is a chance that we would find the dominant severe  lesion that could be treated with angioplasty/stent. This procedure  has been fully reviewed with the patient and and his daughter and informed consent has been obtained.  Scheduled for next Tuesday with Dr. Clifton James.  Hold metformin, ARB, HCTZ on the day of the procedure.  Start taking aspirin 81 mg daily today.  Note that his LDL cholesterol is pretty good at 72, but either way I think we will recommend lipid-lowering therapy, even if we do not find severe coronary stenoses. CHF: He is describing symptoms of exertional dyspnea that may be due to multivessel CAD.  Reevaluate LVEF, modality depending on the findings at angiography. DM: Borderline, hemoglobin A1c 6.5% before starting medical therapy with metformin.  Will get an updated lipid profile today. Aortic atherosclerosis: With normal caliber aorta. HTN: BP was a little high today, but he was nervous.  May need to adjust the choice of antihypertensive medications depending on the findings of cardiac catheterization.      Informed Consent   Shared Decision Making/Informed Consent The risks [stroke (1 in 1000), death (1 in 1000), kidney failure [usually temporary] (1 in 500), bleeding (1 in 200), allergic reaction [possibly serious] (1 in 200)], benefits (diagnostic support and management of coronary artery disease) and alternatives of a cardiac catheterization were discussed in detail with Mr. Cuffee and he is willing to proceed.       Medication Adjustments/Labs and Tests Ordered: Current medicines are reviewed at length with the patient today.  Concerns regarding medicines are outlined above.  Orders Placed This Encounter  Procedures   CBC   Basic metabolic panel   Lipid panel   EKG 12-Lead   No orders of the defined types were placed in this encounter.   Patient Instructions  Medication Instructions:  Start taking Aspirin 81 mg daily *If you need a refill on your cardiac medications before your next appointment, please call your  pharmacy*   Lab Work: CBC, BMP, Lipid- today If you have labs (blood work) drawn today and your tests are completely normal, you will receive your results only by: MyChart Message (if you have MyChart) OR A paper copy in the mail If you have any lab test that is abnormal or we need to change your treatment, we will call you to review the results.   Testing/Procedures:  [place] A DEPT OF [facility] AT [facility] [address] [place] [address] Dept: [phone] Loc: [phone]  MIHIR FLANIGAN  01/29/2023  You are scheduled for a Cardiac Catheterization on Tuesday, November 12 with Dr. Verne Carrow.  1. Please arrive at the [facility] (Main Entrance A) at [facility]: [address] [place], [place] [address] at 11:30 AM (This time is 2 hour(s) before your procedure to ensure your preparation). Free valet parking service is available. You will check in at ADMITTING. The support person will be asked to wait in the waiting room.  It is OK to have someone drop you off and come back when you are ready to be discharged.    Special note: Every effort is made to have your procedure done on time. Please understand that emergencies sometimes delay scheduled procedures.  2. Diet: Do not eat solid foods after midnight.  The patient may have clear liquids until 5am upon the day of the procedure.  3. Labs: You will need to have blood drawn on 01/29/23  4. Medication instructions in preparation for your procedure:   Contrast Allergy: History of Nausea and vomiting  DO  NOT TAKE METFORMIN, LOSARTAN, OR HYDROCHLOROTHIAZIDE ON THE MORNING OF THE PROCEDURE    On the morning of your procedure, take your Aspirin 81 mg and any morning medicines NOT listed above.  You may use sips of water.  5. Plan to go home the same day, you will only stay overnight if medically necessary. 6. Bring a current list of  your medications and current insurance cards. 7. You MUST have a responsible person to drive you home. 8. Someone MUST be with you the first 24 hours after you arrive home or your discharge will be delayed. 9. Please wear clothes that are easy to get on and off and wear slip-on shoes.  Thank you for allowing [place] to care for you!   -- [facility]    Follow-Up: At [facility], you and your health needs are our priority.  As part of our continuing mission to provide you with exceptional heart care, we have created designated Provider Care Teams.  These Care Teams include your primary Cardiologist (physician) and Advanced Practice Providers (APPs -  Physician Assistants and Nurse Practitioners) who all work together to provide you with the care you need, when you need it.  We recommend signing up for the patient portal called "MyChart".  Sign up information is provided on this After Visit Summary.  MyChart is used to connect with patients for Virtual Visits (Telemedicine).  Patients are able to view lab/test results, encounter notes, upcoming appointments, etc.  Non-urgent messages can be sent to your provider as well.   To learn more about what you can do with MyChart, go to [email or web address].    Your next appointment:   3-4  month(s)  Provider:   Dr Royann Shivers    Signed, Thurmon Fair, MD  01/29/2023 4:29 PM    [facility]

## 2023-01-30 ENCOUNTER — Telehealth: Payer: Self-pay | Admitting: Emergency Medicine

## 2023-01-30 ENCOUNTER — Ambulatory Visit: Payer: PPO | Admitting: Cardiovascular Disease

## 2023-01-30 LAB — CBC
Hematocrit: 47.9 % (ref 37.5–51.0)
Hemoglobin: 15.6 g/dL (ref 13.0–17.7)
MCH: 29.1 pg (ref 26.6–33.0)
MCHC: 32.6 g/dL (ref 31.5–35.7)
MCV: 89 fL (ref 79–97)
Platelets: 267 10*3/uL (ref 150–450)
RBC: 5.36 x10E6/uL (ref 4.14–5.80)
RDW: 12.5 % (ref 11.6–15.4)
WBC: 11.5 10*3/uL — ABNORMAL HIGH (ref 3.4–10.8)

## 2023-01-30 LAB — LIPID PANEL
Chol/HDL Ratio: 2.7 ratio (ref 0.0–5.0)
Cholesterol, Total: 153 mg/dL (ref 100–199)
HDL: 56 mg/dL (ref >39)
LDL Chol Calc (NIH): 80 mg/dL (ref 0–99)
Triglycerides: 90 mg/dL (ref 0–149)
VLDL Cholesterol Cal: 17 mg/dL (ref 5–40)

## 2023-01-30 LAB — BASIC METABOLIC PANEL
BUN/Creatinine Ratio: 15 (ref 10–24)
BUN: 17 mg/dL (ref 8–27)
CO2: 24 mmol/L (ref 20–29)
Calcium: 10.3 mg/dL — ABNORMAL HIGH (ref 8.6–10.2)
Chloride: 99 mmol/L (ref 96–106)
Creatinine, Ser: 1.15 mg/dL (ref 0.76–1.27)
Glucose: 108 mg/dL — ABNORMAL HIGH (ref 70–99)
Potassium: 5.1 mmol/L (ref 3.5–5.2)
Sodium: 139 mmol/L (ref 134–144)
eGFR: 66 mL/min/{1.73_m2} (ref >59)

## 2023-01-30 MED ORDER — ROSUVASTATIN CALCIUM 10 MG PO TABS: 10.0000 mg | ORAL_TABLET | Freq: Every day | ORAL | 3 refills | Status: DC

## 2023-01-30 NOTE — Telephone Encounter (Signed)
Croitoru, Rachelle Hora, MD  Donita Brooks, MD Cc: Scheryl Marten, RN Pre-cardiac cath labs are generally okay except for borderline elevation in WBC and calcium level.  Glucose was nonfasting. Cholesterol profile is just slightly shy of desirable range, regardless of the findings on cardiac cath (would like LDL less than 70 considering the burden of coronary calcification).  Please start rosuvastatin 10 mg daily.  Called patient to verify which pharmacy he would like the prescription sent to. He verbalized understanding of all information. Prescription sent.

## 2023-02-04 ENCOUNTER — Telehealth: Payer: Self-pay | Admitting: *Deleted

## 2023-02-04 NOTE — Telephone Encounter (Signed)
Patient reports a history of nausea and vomiting with old contrast. Patient reports he has had more recent IV contrast and has tolerated it without problems.

## 2023-02-04 NOTE — Telephone Encounter (Signed)
Cardiac Catheterization scheduled at [facility] for: Tuesday February 05, 2023 1:30 PM Arrival time [facility] Main Entrance A at: 11:30 AM  Nothing to eat after midnight prior to procedure, clear liquids until 5 AM day of procedure.  CONTRAST ALLERGY: pt reports a history of nausea and vomiting with old contrast. Patient reports he has had more recent IV contrast and has tolerated it without problems.  Medication instructions: -Hold:  Hydrochlorothiazide-AM of procedure  Metformin-day of procedure and 48 hours post procedure -Other usual morning medications can be taken with sips of water including aspirin 81 mg.  Plan to go home the same day, you will only stay overnight if medically necessary.  You must have responsible adult to drive you home.  Someone must be with you the first 24 hours after you arrive home.  Reviewed procedure instructions with patient.

## 2023-02-05 ENCOUNTER — Other Ambulatory Visit: Payer: Self-pay

## 2023-02-05 ENCOUNTER — Encounter (HOSPITAL_COMMUNITY)
Admission: RE | Disposition: A | Discharge status: Home / Self Care | Payer: Self-pay | Source: Home / Self Care | Attending: Cardiovascular Disease

## 2023-02-05 ENCOUNTER — Ambulatory Visit (HOSPITAL_COMMUNITY)
Admission: RE | Admit: 2023-02-05 | Discharge: 2023-02-05 | Disposition: A | Discharge status: Home / Self Care | Payer: PPO | Attending: Cardiovascular Disease | Admitting: Cardiovascular Disease

## 2023-02-05 DIAGNOSIS — I509 Heart failure, unspecified: Secondary | ICD-10-CM | POA: Diagnosis not present

## 2023-02-05 DIAGNOSIS — I2510 Atherosclerotic heart disease of native coronary artery without angina pectoris: Secondary | ICD-10-CM | POA: Diagnosis not present

## 2023-02-05 DIAGNOSIS — I110 Hypertensive heart disease with heart failure: Secondary | ICD-10-CM | POA: Insufficient documentation

## 2023-02-05 DIAGNOSIS — R072 Precordial pain: Secondary | ICD-10-CM | POA: Diagnosis present

## 2023-02-05 DIAGNOSIS — R7303 Prediabetes: Secondary | ICD-10-CM | POA: Diagnosis not present

## 2023-02-05 DIAGNOSIS — E785 Hyperlipidemia, unspecified: Secondary | ICD-10-CM | POA: Insufficient documentation

## 2023-02-05 DIAGNOSIS — Z87891 Personal history of nicotine dependence: Secondary | ICD-10-CM | POA: Insufficient documentation

## 2023-02-05 DIAGNOSIS — Z7984 Long term (current) use of oral hypoglycemic drugs: Secondary | ICD-10-CM | POA: Diagnosis not present

## 2023-02-05 DIAGNOSIS — Z8249 Family history of ischemic heart disease and other diseases of the circulatory system: Secondary | ICD-10-CM | POA: Insufficient documentation

## 2023-02-05 DIAGNOSIS — R0602 Shortness of breath: Secondary | ICD-10-CM | POA: Diagnosis not present

## 2023-02-05 DIAGNOSIS — I700 Atherosclerosis of aorta: Secondary | ICD-10-CM | POA: Diagnosis not present

## 2023-02-05 HISTORY — PX: LEFT HEART CATH AND CORONARY ANGIOGRAPHY: CATH118249

## 2023-02-05 LAB — GLUCOSE, CAPILLARY: Glucose-Capillary: 101 mg/dL — ABNORMAL HIGH (ref 70–99)

## 2023-02-05 SURGERY — LEFT HEART CATH AND CORONARY ANGIOGRAPHY
Anesthesia: LOCAL

## 2023-02-05 MED ORDER — LIDOCAINE HCL (PF) 1 % IJ SOLN
INTRAMUSCULAR | Status: AC
  Filled 2023-02-05: qty 30

## 2023-02-05 MED ORDER — HEPARIN SODIUM (PORCINE) 1000 UNIT/ML IJ SOLN
INTRAMUSCULAR | Status: AC
  Filled 2023-02-05: qty 10

## 2023-02-05 MED ORDER — SODIUM CHLORIDE 0.9 % WEIGHT BASED INFUSION
3.0000 mL/kg/h | INTRAVENOUS | Status: AC
  Administered 2023-02-05: 3 mL/kg/h via INTRAVENOUS

## 2023-02-05 MED ORDER — SODIUM CHLORIDE 0.9 % WEIGHT BASED INFUSION: 1.0000 mL/kg/h | INTRAVENOUS | Status: DC

## 2023-02-05 MED ORDER — IOHEXOL 350 MG/ML SOLN
INTRAVENOUS | Status: DC | PRN
  Administered 2023-02-05: 60 mL

## 2023-02-05 MED ORDER — VERAPAMIL HCL 2.5 MG/ML IV SOLN
INTRAVENOUS | Status: AC
  Filled 2023-02-05: qty 2

## 2023-02-05 MED ORDER — MIDAZOLAM HCL 2 MG/2ML IJ SOLN
INTRAMUSCULAR | Status: DC | PRN
  Administered 2023-02-05: 1 mg via INTRAVENOUS

## 2023-02-05 MED ORDER — FENTANYL CITRATE (PF) 100 MCG/2ML IJ SOLN
INTRAMUSCULAR | Status: AC
  Filled 2023-02-05: qty 2

## 2023-02-05 MED ORDER — VERAPAMIL HCL 2.5 MG/ML IV SOLN
INTRAVENOUS | Status: DC | PRN
  Administered 2023-02-05: 10 mL via INTRA_ARTERIAL

## 2023-02-05 MED ORDER — HEPARIN SODIUM (PORCINE) 1000 UNIT/ML IJ SOLN
INTRAMUSCULAR | Status: DC | PRN
  Administered 2023-02-05: 4000 [IU] via INTRAVENOUS

## 2023-02-05 MED ORDER — FENTANYL CITRATE (PF) 100 MCG/2ML IJ SOLN
INTRAMUSCULAR | Status: DC | PRN
  Administered 2023-02-05: 25 ug via INTRAVENOUS

## 2023-02-05 MED ORDER — HEPARIN (PORCINE) IN NACL 1000-0.9 UT/500ML-% IV SOLN
INTRAVENOUS | Status: DC | PRN
  Administered 2023-02-05 (×2): 500 mL

## 2023-02-05 MED ORDER — ASPIRIN 81 MG PO CHEW: 81.0000 mg | CHEWABLE_TABLET | ORAL | Status: DC

## 2023-02-05 MED ORDER — LIDOCAINE HCL (PF) 1 % IJ SOLN
INTRAMUSCULAR | Status: DC | PRN
  Administered 2023-02-05: 5 mL via INTRADERMAL

## 2023-02-05 MED ORDER — MIDAZOLAM HCL 2 MG/2ML IJ SOLN
INTRAMUSCULAR | Status: AC
  Filled 2023-02-05: qty 2

## 2023-02-05 SURGICAL SUPPLY — 9 items
CATH 5FR JL3.5 JR4 ANG PIG MP (CATHETERS) IMPLANT
CATH INFINITI 5 FR 3DRC (CATHETERS) IMPLANT
CATH INFINITI 5 FR AR1 MOD (CATHETERS) IMPLANT
DEVICE RAD COMP TR BAND LRG (VASCULAR PRODUCTS) IMPLANT
GLIDESHEATH SLEND SS 6F .021 (SHEATH) IMPLANT
GUIDEWIRE INQWIRE 1.5J.035X260 (WIRE) IMPLANT
INQWIRE 1.5J .035X260CM (WIRE) ×1
PACK CARDIAC CATHETERIZATION (CUSTOM PROCEDURE TRAY) ×2 IMPLANT
SET ATX-X65L (MISCELLANEOUS) IMPLANT

## 2023-02-05 NOTE — Discharge Instructions (Addendum)
Hold metformin for 48 hours post cath.    Radial Site Care  This sheet gives you information about how to care for yourself after your procedure. Your health care provider may also give you more specific instructions. If you have problems or questions, contact your health care provider. What can I expect after the procedure? After the procedure, it is common to have: Bruising and tenderness at the catheter insertion area. Follow these instructions at home: Medicines Take over-the-counter and prescription medicines only as told by your health care provider. Insertion site care Follow instructions from your health care provider about how to take care of your insertion site. Make sure you: Wash your hands with soap and water before you remove your bandage (dressing). If soap and water are not available, use hand sanitizer. May remove dressing in 24 hours. Check your insertion site every day for signs of infection. Check for: Redness, swelling, or pain. Fluid or blood. Pus or a bad smell. Warmth. Do no take baths, swim, or use a hot tub for 5 days. You may shower 24-48 hours after the procedure. Remove the dressing and gently wash the site with plain soap and water. Pat the area dry with a clean towel. Do not rub the site. That could cause bleeding. Do not apply powder or lotion to the site. Activity  For 24 hours after the procedure, or as directed by your health care provider: Do not flex or bend the affected arm. Do not push or pull heavy objects with the affected arm. Do not drive yourself home from the hospital or clinic. You may drive 24 hours after the procedure. Do not operate machinery or power tools. KEEP ARM ELEVATED THE REMAINDER OF THE DAY. Do not push, pull or lift anything that is heavier than 10 lb for 5 days. Ask your health care provider when it is okay to: Return to work or school. Resume usual physical activities or sports. Resume sexual activity. General  instructions If the catheter site starts to bleed, raise your arm and put firm pressure on the site. If the bleeding does not stop, get help right away. This is a medical emergency. DRINK PLENTY OF FLUIDS FOR THE NEXT 2-3 DAYS. No alcohol consumption for 24 hours after receiving sedation. If you went home on the same day as your procedure, a responsible adult should be with you for the first 24 hours after you arrive home. Keep all follow-up visits as told by your health care provider. This is important. Contact a health care provider if: You have a fever. You have redness, swelling, or yellow drainage around your insertion site. Get help right away if: You have unusual pain at the radial site. The catheter insertion area swells very fast. The insertion area is bleeding, and the bleeding does not stop when you hold steady pressure on the area. Your arm or hand becomes pale, cool, tingly, or numb. These symptoms may represent a serious problem that is an emergency. Do not wait to see if the symptoms will go away. Get medical help right away. Call your local emergency services (911 in the [place] Do not drive yourself to the hospital. Summary After the procedure, it is common to have bruising and tenderness at the site. Follow instructions from your health care provider about how to take care of your radial site wound. Check the wound every day for signs of infection.  This information is not intended to replace advice given to you by your health care  provider. Make sure you discuss any questions you have with your health care provider. Document Revised: 04/17/2017 Document Reviewed: 04/17/2017 Elsevier Patient Education  2020 [employer].

## 2023-02-05 NOTE — Interval H&P Note (Signed)
History and Physical Interval Note:  02/05/2023 12:36 PM  James Harper  has presented today for surgery, with the diagnosis of chest pain.  The various methods of treatment have been discussed with the patient and family. After consideration of risks, benefits and other options for treatment, the patient has consented to  Procedure(s): LEFT HEART CATH AND CORONARY ANGIOGRAPHY (N/A) as a surgical intervention.  The patient's history has been reviewed, patient examined, no change in status, stable for surgery.  I have reviewed the patient's chart and labs.  Questions were answered to the patient's satisfaction.    Cath Lab Visit (complete for each Cath Lab visit)  Clinical Evaluation Leading to the Procedure:   ACS: No.  Non-ACS:    Anginal Classification: CCS III  Anti-ischemic medical therapy: Minimal Therapy (1 class of medications)  Non-Invasive Test Results: No non-invasive testing performed  Prior CABG: No previous CABG        Verne Carrow

## 2023-02-06 ENCOUNTER — Encounter (HOSPITAL_COMMUNITY): Payer: Self-pay | Admitting: Cardiovascular Disease

## 2023-02-26 ENCOUNTER — Other Ambulatory Visit: Payer: Self-pay | Admitting: Family Medicine

## 2023-02-26 DIAGNOSIS — I10 Essential (primary) hypertension: Secondary | ICD-10-CM

## 2023-02-28 NOTE — Telephone Encounter (Signed)
Requested medication (s) are due for refill today: yes  Requested medication (s) are on the active medication list: yes  Last refill:  02/19/22 #90/3  Future visit scheduled: no  Notes to clinic:  pt needing OV, LOV for HTN was 07/2021. Please advise rx expired.      Requested Prescriptions  Pending Prescriptions Disp Refills   losartan (COZAAR) 100 MG tablet [Pharmacy Med Name: LOSARTAN POTASSIUM 100 MG TAB] 90 tablet 3    Sig: TAKE ONE TABLET (100MG  TOTAL) BY MOUTH DAILY     Cardiovascular:  Angiotensin Receptor Blockers Failed - 02/26/2023  1:45 PM      Failed - Last BP in normal range    BP Readings from Last 1 Encounters:  02/05/23 (!) 144/74         Failed - Valid encounter within last 6 months    Recent Outpatient Visits           1 year ago Benign essential HTN   [facility] Tanya Nones, Priscille Heidelberg, MD   2 years ago Encounter for Medicare annual wellness exam   [facility] Donita Brooks, MD   2 years ago Benign essential HTN   [facility] Donita Brooks, MD   3 years ago Uncontrolled diabetes mellitus with stage 2 chronic kidney disease, without long-term current use of insulin (HCC)   [facility] Pickard, Priscille Heidelberg, MD   4 years ago Uncontrolled diabetes mellitus with stage 2 chronic kidney disease, without long-term current use of insulin (HCC)   [facility] Pickard, Priscille Heidelberg, MD       Future Appointments             In 2 months Croitoru, Mihai, MD [facility] at [facility] - Cr in normal range and within 180 days    Creat  Date Value Ref Range Status  12/20/2022 1.23 0.70 - 1.28 mg/dL Final   Creatinine, Ser  Date Value Ref Range Status  01/29/2023 1.15 0.76 - 1.27 mg/dL Final         Passed - K in normal range and within 180 days    Potassium  Date Value Ref Range Status  01/29/2023 5.1 3.5 - 5.2 mmol/L Final         Passed  - Patient is not pregnant

## 2023-03-04 NOTE — Telephone Encounter (Signed)
Patient came to the office to follow up on refill requested for losartan (COZAAR) 100 MG tablet   LOV 12/20/2022  Pharmacy confirmed as:  [facility] - [place],  - [address], [place] [address] Phone: [phone]  Fax: [phone]   Med refill appt scheduled for 03/22/2023.  Please advise patient at [phone].

## 2023-03-12 ENCOUNTER — Other Ambulatory Visit: Payer: Self-pay | Admitting: Family Medicine

## 2023-03-12 DIAGNOSIS — I10 Essential (primary) hypertension: Secondary | ICD-10-CM

## 2023-03-13 ENCOUNTER — Ambulatory Visit: Payer: PPO | Admitting: Cardiovascular Disease

## 2023-03-22 ENCOUNTER — Encounter: Payer: Self-pay | Admitting: Family Medicine

## 2023-03-22 ENCOUNTER — Ambulatory Visit (INDEPENDENT_AMBULATORY_CARE_PROVIDER_SITE_OTHER): Payer: PPO | Admitting: Family Medicine

## 2023-03-22 ENCOUNTER — Telehealth: Payer: Self-pay

## 2023-03-22 VITALS — BP 126/84 | HR 66 | Temp 98.4°F | Ht 69.0 in | Wt 190.1 lb

## 2023-03-22 DIAGNOSIS — R1011 Right upper quadrant pain: Secondary | ICD-10-CM | POA: Diagnosis not present

## 2023-03-22 DIAGNOSIS — Z125 Encounter for screening for malignant neoplasm of prostate: Secondary | ICD-10-CM

## 2023-03-22 DIAGNOSIS — E118 Type 2 diabetes mellitus with unspecified complications: Secondary | ICD-10-CM

## 2023-03-22 NOTE — Progress Notes (Signed)
Wt Readings from Last 3 Encounters:  03/22/23 190 lb 2 oz (86.2 kg)  02/05/23 188 lb (85.3 kg)  01/29/23 190 lb (86.2 kg)     Subjective:    Patient ID: James Harper, male    DOB: 1945-12-27, 77 y.o.   MRN: [id number]  Please see my last office visit from September.  At that time the patient was having epigastric abdominal pain and right upper quadrant pain.  We tried the patient on a proton pump inhibitor but that has not helped at all.  A right upper quadrant ultrasound revealed biliary sludge.  The patient states that he has pain now in the right upper quadrant.  It occurs every day.  Most of the times it is unprovoked.  It will radiate directly into his shoulder blade.  It hurts to palpate in the right upper quadrant.  He denies any melena or hematochezia or weight loss.  Patient recently had a catheterization that showed nonobstructive coronary artery disease despite his elevated calcium score.  Therefore he is cleared to proceed with surgery if necessary from a cardiac standpoint.  His blood pressure is well-controlled.  He is due to recheck his diabetes/hemoglobin A1c. Past Medical History:  Diagnosis Date   GERD (gastroesophageal reflux disease)    Hep C w/o coma, chronic (HCC)    Hypertension    PONV (postoperative nausea and vomiting)    Prediabetes    Past Surgical History:  Procedure Laterality Date   BACK SURGERY  1980   BALLOON DILATION N/A 05/19/2021   Procedure: BALLOON DILATION;  Surgeon: Lanelle Bal, DO;  [facility];  Service: Endoscopy;  Laterality: N/A;   COLONOSCOPY WITH PROPOFOL N/A 05/19/2021   Procedure: COLONOSCOPY WITH PROPOFOL;  Surgeon: Lanelle Bal, DO;  [facility];  Service: Endoscopy;  Laterality: N/A;  8:00am   ESOPHAGOGASTRODUODENOSCOPY (EGD) WITH PROPOFOL N/A 05/19/2021   Procedure: ESOPHAGOGASTRODUODENOSCOPY (EGD) WITH PROPOFOL;  Surgeon: Lanelle Bal, DO;  [facility];  Service: Endoscopy;  Laterality:  N/A;   LEFT HEART CATH AND CORONARY ANGIOGRAPHY N/A 02/05/2023   Procedure: LEFT HEART CATH AND CORONARY ANGIOGRAPHY;  Surgeon: Kathleene Hazel, MD;  [facility];  Service: Cardiovascular;  Laterality: N/A;   Current Outpatient Medications on File Prior to Visit  Medication Sig Dispense Refill   aspirin 81 MG chewable tablet Chew 1 tablet (81 mg total) by mouth daily. 90 tablet 0   hydrochlorothiazide (HYDRODIURIL) 12.5 MG tablet TAKE ONE TABLET (12.5MG  TOTAL) BY MOUTH DAILY 90 tablet 3   losartan (COZAAR) 100 MG tablet TAKE ONE TABLET (100MG  TOTAL) BY MOUTH DAILY 90 tablet 3   metFORMIN (GLUCOPHAGE) 500 MG tablet TAKE ONE TABLET (500MG  TOTAL) BY MOUTH TWO TIMES DAILY WITH A MEAL 180 tablet 1   metoprolol succinate (TOPROL-XL) 50 MG 24 hr tablet TAKE ONE TABLET (50MG  TOTAL) BY MOUTH DAILY. TAKE WITH OR IMMEDIATELY FOLLOWING A MEAL. 90 tablet 1   pantoprazole (PROTONIX) 40 MG tablet Take 1 tablet (40 mg total) by mouth 2 (two) times daily. (Patient taking differently: Take 40 mg by mouth daily as needed (Heartburn).) 60 tablet 3   rosuvastatin (CRESTOR) 10 MG tablet Take 1 tablet (10 mg total) by mouth daily. 90 tablet 3   No current facility-administered medications on file prior to visit.   Allergies  Allergen Reactions   Omnipaque [Iohexol] Nausea And Vomiting    Pt states he has severe vomiting with IV contrast, especially the  older kind he had over 20 yrs ago.     Norvasc [Amlodipine Besylate] Swelling   Social History   Socioeconomic History   Marital status: Married    Spouse name: Not on file   Number of children: 2   Years of education: Not on file   Highest education level: Not on file  Occupational History   Not on file  Tobacco Use   Smoking status: Former    Current packs/day: 0.00    Average packs/day: 0.3 packs/day for 15.0 years (3.8 ttl pk-yrs)    Types: Cigarettes    Start date: 08/19/1957    Quit date: 08/19/1972    Years since quitting:  50.6   Smokeless tobacco: Never  Vaping Use   Vaping status: Never Used  Substance and Sexual Activity   Alcohol use: Yes    Comment: rare beer   Drug use: Never   Sexual activity: Not on file  Other Topics Concern   Not on file  Social History Narrative   Not on file   Social Drivers of Health   Financial Resource Strain: Low Risk  (05/15/2022)   Overall Financial Resource Strain (CARDIA)    Difficulty of Paying Living Expenses: Not hard at all  Food Insecurity: No Food Insecurity (05/15/2022)   Hunger Vital Sign    Worried About Running Out of Food in the Last Year: Never true    Ran Out of Food in the Last Year: Never true  Transportation Needs: No Transportation Needs (05/15/2022)   PRAPARE - Administrator, Civil Service (Medical): No    Lack of Transportation (Non-Medical): No  Physical Activity: Sufficiently Active (05/15/2022)   Exercise Vital Sign    Days of Exercise per Week: 5 days    Minutes of Exercise per Session: 30 min  Stress: No Stress Concern Present (05/15/2022)   [employer] of Occupational Health - Occupational Stress Questionnaire    Feeling of Stress : Not at all  Social Connections: Socially Integrated (05/15/2022)   Social Connection and Isolation Panel [NHANES]    Frequency of Communication with Friends and Family: More than three times a week    Frequency of Social Gatherings with Friends and Family: Three times a week    Attends Religious Services: More than 4 times per year    Active Member of Clubs or Organizations: Yes    Attends Banker Meetings: More than 4 times per year    Marital Status: Married  Catering manager Violence: Not At Risk (05/15/2022)   Humiliation, Afraid, Rape, and Kick questionnaire    Fear of Current or Ex-Partner: No    Emotionally Abused: No    Physically Abused: No    Sexually Abused: No    Review of Systems  Gastrointestinal:  Positive for abdominal pain.       Objective:    Physical Exam Vitals reviewed.  Constitutional:      General: He is not in acute distress.    Appearance: Normal appearance. He is not ill-appearing, toxic-appearing or diaphoretic.  Cardiovascular:     Rate and Rhythm: Normal rate and regular rhythm.     Pulses: Normal pulses.     Heart sounds: Normal heart sounds. No murmur heard.    No friction rub. No gallop.  Pulmonary:     Effort: Pulmonary effort is normal. No respiratory distress.     Breath sounds: Normal breath sounds. No stridor. No wheezing, rhonchi or rales.  Abdominal:  General: Abdomen is flat. Bowel sounds are normal. There is no distension.     Palpations: Abdomen is soft. There is no mass.     Tenderness: There is no abdominal tenderness. There is no guarding or rebound.     Hernia: No hernia is present.    Musculoskeletal:     Right lower leg: No edema.     Left lower leg: No edema.  Neurological:     Mental Status: He is alert.           Assessment & Plan:  RUQ pain - Plan: Ambulatory referral to General Surgery  Prostate cancer screening - Plan: PSA  Controlled diabetes mellitus type 2 with complications, unspecified whether long term insulin use (HCC) - Plan: CBC with Differential/Platelet, COMPLETE METABOLIC PANEL WITH GFR, Lipid panel, Hemoglobin A1c Pain is made worse with palpation in the right upper quadrant.  Ultrasound shows biliary sludge.  I feel the patient would benefit from a cholecystectomy.  I will recommend that he talk with the general surgeon.  I do not feel that he requires a HIDA scan.  He is due to screen for prostate cancer so I will check a PSA today.  Blood pressure is acceptable.  I will check a hemoglobin A1c along with a lipid panel.  I like to see his LDL cholesterol less than 55 and his hemoglobin A1c less than 6.5.

## 2023-03-22 NOTE — Telephone Encounter (Signed)
Copied from CRM [id number]. Topic: Clinical - Medical Advice >> Mar 22, 2023  2:02 PM Clayton Bibles wrote: Reason for CRM: Rosemary would like the referral to be changed to [facility] in [place]. Please response through MyChart.

## 2023-03-23 LAB — COMPLETE METABOLIC PANEL WITH GFR
AG Ratio: 1.7 (calc) (ref 1.0–2.5)
ALT: 21 U/L (ref 9–46)
AST: 20 U/L (ref 10–35)
Albumin: 4.7 g/dL (ref 3.6–5.1)
Alkaline phosphatase (APISO): 73 U/L (ref 35–144)
BUN: 21 mg/dL (ref 7–25)
CO2: 25 mmol/L (ref 20–32)
Calcium: 9.9 mg/dL (ref 8.6–10.3)
Chloride: 101 mmol/L (ref 98–110)
Creat: 1.17 mg/dL (ref 0.70–1.28)
Globulin: 2.8 g/dL (ref 1.9–3.7)
Glucose, Bld: 118 mg/dL — ABNORMAL HIGH (ref 65–99)
Potassium: 4.9 mmol/L (ref 3.5–5.3)
Sodium: 138 mmol/L (ref 135–146)
Total Bilirubin: 0.7 mg/dL (ref 0.2–1.2)
Total Protein: 7.5 g/dL (ref 6.1–8.1)
eGFR: 64 mL/min/{1.73_m2} (ref >=60)

## 2023-03-23 LAB — HEMOGLOBIN A1C
Hgb A1c MFr Bld: 6.6 %{Hb} — ABNORMAL HIGH (ref <5.7)
Mean Plasma Glucose: 143 mg/dL
eAG (mmol/L): 7.9 mmol/L

## 2023-03-23 LAB — CBC WITH DIFFERENTIAL/PLATELET
Absolute Lymphocytes: 1849 {cells}/uL (ref 850–3900)
Absolute Monocytes: 632 {cells}/uL (ref 200–950)
Basophils Absolute: 63 {cells}/uL (ref 0–200)
Basophils Relative: 0.8 %
Eosinophils Absolute: 269 {cells}/uL (ref 15–500)
Eosinophils Relative: 3.4 %
HCT: 47.5 % (ref 38.5–50.0)
Hemoglobin: 15.6 g/dL (ref 13.2–17.1)
MCH: 28.9 pg (ref 27.0–33.0)
MCHC: 32.8 g/dL (ref 32.0–36.0)
MCV: 88 fL (ref 80.0–100.0)
MPV: 11.5 fL (ref 7.5–12.5)
Monocytes Relative: 8 %
Neutro Abs: 5088 {cells}/uL (ref 1500–7800)
Neutrophils Relative %: 64.4 %
Platelets: 249 10*3/uL (ref 140–400)
RBC: 5.4 10*6/uL (ref 4.20–5.80)
RDW: 12.4 % (ref 11.0–15.0)
Total Lymphocyte: 23.4 %
WBC: 7.9 10*3/uL (ref 3.8–10.8)

## 2023-03-23 LAB — LIPID PANEL
Cholesterol: 115 mg/dL (ref <200)
HDL: 58 mg/dL (ref >=40)
LDL Cholesterol (Calc): 41 mg/dL
Non-HDL Cholesterol (Calc): 57 mg/dL (ref <130)
Total CHOL/HDL Ratio: 2 (calc) (ref <5.0)
Triglycerides: 75 mg/dL (ref <150)

## 2023-03-23 LAB — PSA: PSA: 0.23 ng/mL (ref <=4.00)

## 2023-04-04 ENCOUNTER — Other Ambulatory Visit: Payer: Self-pay | Admitting: General Surgery

## 2023-04-04 ENCOUNTER — Telehealth: Payer: Self-pay

## 2023-04-04 DIAGNOSIS — K8020 Calculus of gallbladder without cholecystitis without obstruction: Secondary | ICD-10-CM | POA: Diagnosis not present

## 2023-04-04 NOTE — Telephone Encounter (Signed)
   Pre-operative Risk Assessment    Patient Name: James Harper  DOB: 1946/01/02 MRN: [id number]   Date of last office visit: 01/29/23 Date of next office visit: 05/21/23  Request for Surgical Clearance    Procedure:  Lap Chole  Date of Surgery:  Clearance TBD                                 Surgeon:  Dr. Donnice Bury  Surgeon's Group or Practice Name:  [facility]  Phone number:  [phone] Fax number:  [phone]   Type of Clearance Requested:   - Medical    Type of Anesthesia:  General    Additional requests/questions:    SignedConnye GORMAN Hoit   04/04/2023, 5:27 PM

## 2023-04-05 NOTE — Telephone Encounter (Signed)
 Pt appt has been moved up to see Dr. Royann Shivers 04/12/23 @ 4:20 pm. Pt needs pre op clearance. Pt asked to be put ion wait list as well.

## 2023-04-05 NOTE — Telephone Encounter (Signed)
   Name: James Harper  DOB: 01-11-1946  MRN: [id number]  Primary Cardiologist: None  Chart reviewed as part of pre-operative protocol coverage. Because of [name]'s past medical history and time since last visit, he will require a follow-up in-office visit in order to better assess preoperative cardiovascular risk.  Pre-op covering staff: - Please schedule appointment and call patient to inform them. If patient already had an upcoming appointment within acceptable timeframe, please add pre-op clearance to the appointment notes so provider is aware. - Please contact requesting surgeon's office via preferred method (i.e, phone, fax) to inform them of need for appointment prior to surgery.  Patient just underwent cardiac cath and will need in person follow-up. No medications indicated as needing held.   [name], PA-C  04/05/2023, 11:01 AM

## 2023-04-12 ENCOUNTER — Encounter: Payer: Self-pay | Admitting: Cardiovascular Disease

## 2023-04-12 ENCOUNTER — Ambulatory Visit: Payer: PPO | Attending: Cardiovascular Disease | Admitting: Cardiovascular Disease

## 2023-04-12 VITALS — BP 149/79 | HR 64 | Ht 69.0 in | Wt 189.8 lb

## 2023-04-12 DIAGNOSIS — I700 Atherosclerosis of aorta: Secondary | ICD-10-CM

## 2023-04-12 DIAGNOSIS — I2510 Atherosclerotic heart disease of native coronary artery without angina pectoris: Secondary | ICD-10-CM | POA: Diagnosis not present

## 2023-04-12 DIAGNOSIS — Z01810 Encounter for preprocedural cardiovascular examination: Secondary | ICD-10-CM | POA: Diagnosis not present

## 2023-04-12 DIAGNOSIS — I25118 Atherosclerotic heart disease of native coronary artery with other forms of angina pectoris: Secondary | ICD-10-CM

## 2023-04-12 DIAGNOSIS — I10 Essential (primary) hypertension: Secondary | ICD-10-CM | POA: Diagnosis not present

## 2023-04-12 DIAGNOSIS — E1169 Type 2 diabetes mellitus with other specified complication: Secondary | ICD-10-CM | POA: Diagnosis not present

## 2023-04-12 NOTE — Progress Notes (Unsigned)
Cardiology Office Note:    Date:  04/13/2023   ID:  Argo, Mccowin Jun 19, 1945, MRN [id number]  PCP:  Donita Brooks, MD   [facility] Providers Cardiologist:  None     Referring MD: Donita Brooks, MD   Chief Complaint  Patient presents with   Pre-op Exam    History of Present Illness:    James Harper is a 78 y.o. male with a hx of HTN, GERD (previous esophageal dilation), borderline diabetes mellitus type 2, aortic atherosclerosis and heavy coronary arthrosclerotic calcification.  He underwent cardiac catheterization 02/05/2024 that showed widespread but nonobstructive plaque with a maximum of 20-30% stenoses seen in the ostial LAD, mid LAD, distal RCA and first oblique marginal artery.  He had normal LV filling pressures.  Last month he began evaluation for right upper quadrant pain and was found to have gallstones.  He plans to have a cholecystectomy with Dr. Dwain Sarna in the near future.  He has a very strong family history of premature CAD.  He has a brother who had his first bypass surgery in his 14s and then had to undergo redo bypass surgery.  He has a sister had a myocardial infarction in her 18s.  His mother died of a myocardial infarction at age 6.  He has no cardiovascular complaints today.  His blood pressure is a little high even after he relaxed for a few minutes.  He reports that this commonly occurs when he comes to the doctor's office and that at home his blood pressure monitor shows readings of 120s-130s/60s.  His most recent hemoglobin A1c was fully in type 2 diabetes mellitus range at 6.6%.  After starting statin therapy his LDL cholesterol is down to 41.  He does not smoke (quit 50 years ago).  In 2021 he also underwent echocardiogram and nuclear stress test at [facility] cardiovascular.  The studies were performed for "frequent PVCs".  The ejection fraction was 50-55% on the echocardiogram, 52% on the nuclear stress test.  There was evidence of  aortic valve sclerosis, mild aortic insufficiency, mild mitral insufficiency.  Exercise time on the treadmill was very short (5 minutes, 7 METS), but no ischemic ECG changes were seen.  Perfusion was described as normal.  Past Medical History:  Diagnosis Date   GERD (gastroesophageal reflux disease)    Hep C w/o coma, chronic (HCC)    Hypertension    PONV (postoperative nausea and vomiting)    Prediabetes     Past Surgical History:  Procedure Laterality Date   BACK SURGERY  1980   BALLOON DILATION N/A 05/19/2021   Procedure: BALLOON DILATION;  Surgeon: Lanelle Bal, DO;  [facility];  Service: Endoscopy;  Laterality: N/A;   COLONOSCOPY WITH PROPOFOL N/A 05/19/2021   Procedure: COLONOSCOPY WITH PROPOFOL;  Surgeon: Lanelle Bal, DO;  [facility];  Service: Endoscopy;  Laterality: N/A;  8:00am   ESOPHAGOGASTRODUODENOSCOPY (EGD) WITH PROPOFOL N/A 05/19/2021   Procedure: ESOPHAGOGASTRODUODENOSCOPY (EGD) WITH PROPOFOL;  Surgeon: Lanelle Bal, DO;  [facility];  Service: Endoscopy;  Laterality: N/A;   LEFT HEART CATH AND CORONARY ANGIOGRAPHY N/A 02/05/2023   Procedure: LEFT HEART CATH AND CORONARY ANGIOGRAPHY;  Surgeon: Kathleene Hazel, MD;  [facility];  Service: Cardiovascular;  Laterality: N/A;    Current Medications: Current Meds  Medication Sig   aspirin EC 81 MG tablet Take 81 mg by mouth daily.   hydrochlorothiazide (HYDRODIURIL) 12.5 MG  tablet TAKE ONE TABLET (12.5MG  TOTAL) BY MOUTH DAILY   losartan (COZAAR) 100 MG tablet TAKE ONE TABLET (100MG  TOTAL) BY MOUTH DAILY   metFORMIN (GLUCOPHAGE) 500 MG tablet TAKE ONE TABLET (500MG  TOTAL) BY MOUTH TWO TIMES DAILY WITH A MEAL   metoprolol succinate (TOPROL-XL) 50 MG 24 hr tablet TAKE ONE TABLET (50MG  TOTAL) BY MOUTH DAILY. TAKE WITH OR IMMEDIATELY FOLLOWING A MEAL.   pantoprazole (PROTONIX) 40 MG tablet Take 1 tablet (40 mg total) by mouth 2 (two) times daily.    rosuvastatin (CRESTOR) 10 MG tablet Take 1 tablet (10 mg total) by mouth daily.     Allergies:   Omnipaque [iohexol] and Norvasc [amlodipine besylate]   Social History   Socioeconomic History   Marital status: Married    Spouse name: Not on file   Number of children: 2   Years of education: Not on file   Highest education level: Not on file  Occupational History   Not on file  Tobacco Use   Smoking status: Former    Current packs/day: 0.00    Average packs/day: 0.3 packs/day for 15.0 years (3.8 ttl pk-yrs)    Types: Cigarettes    Start date: 08/19/1957    Quit date: 08/19/1972    Years since quitting: 50.6   Smokeless tobacco: Never  Vaping Use   Vaping status: Never Used  Substance and Sexual Activity   Alcohol use: Yes    Comment: rare beer   Drug use: Never   Sexual activity: Not on file  Other Topics Concern   Not on file  Social History Narrative   Not on file   Social Drivers of Health   Financial Resource Strain: Low Risk  (05/15/2022)   Overall Financial Resource Strain (CARDIA)    Difficulty of Paying Living Expenses: Not hard at all  Food Insecurity: No Food Insecurity (05/15/2022)   Hunger Vital Sign    Worried About Running Out of Food in the Last Year: Never true    Ran Out of Food in the Last Year: Never true  Transportation Needs: No Transportation Needs (05/15/2022)   PRAPARE - Administrator, Civil Service (Medical): No    Lack of Transportation (Non-Medical): No  Physical Activity: Sufficiently Active (05/15/2022)   Exercise Vital Sign    Days of Exercise per Week: 5 days    Minutes of Exercise per Session: 30 min  Stress: No Stress Concern Present (05/15/2022)   [employer] of Occupational Health - Occupational Stress Questionnaire    Feeling of Stress : Not at all  Social Connections: Socially Integrated (05/15/2022)   Social Connection and Isolation Panel [NHANES]    Frequency of Communication with Friends and Family: More than  three times a week    Frequency of Social Gatherings with Friends and Family: Three times a week    Attends Religious Services: More than 4 times per year    Active Member of Clubs or Organizations: Yes    Attends Engineer, structural: More than 4 times per year    Marital Status: Married     Family History: The patient's family history includes Diabetes in his brother and sister; Heart attack in his mother; Heart disease in his brother, mother, and sister. There is no history of Colon cancer or Colon polyps.  ROS:   Please see the history of present illness.     All other systems reviewed and are negative.  EKGs/Labs/Other Studies Reviewed:    The following  studies were reviewed today:  EKG Interpretation Date/Time:  Friday April 12 2023 16:24:11 EST Ventricular Rate:  64 PR Interval:  190 QRS Duration:  82 QT Interval:  388 QTC Calculation: 400 R Axis:   -9  Text Interpretation: Sinus rhythm with occasional Premature ventricular complexes Septal infarct (cited on or before 29-Jan-2023) When compared with ECG of 29-Jan-2023 12:59, Premature ventricular complexes are now Present Confirmed by Shailene Demonbreun [phone]) on 04/12/2023 4:42:38 PM    Recent Labs: 03/22/2023: ALT 21; BUN 21; Creat 1.17; Hemoglobin 15.6; Platelets 249; Potassium 4.9; Sodium 138  Recent Lipid Panel    Component Value Date/Time   CHOL 115 03/22/2023 0936   CHOL 153 01/29/2023 1319   TRIG 75 03/22/2023 0936   HDL 58 03/22/2023 0936   HDL 56 01/29/2023 1319   CHOLHDL 2.0 03/22/2023 0936   VLDL 14 12/27/2015 0818   LDLCALC 41 03/22/2023 0936     Risk Assessment/Calculations:      HYPERTENSION CONTROL Vitals:   04/12/23 1618 04/12/23 1656  BP: (!) 152/74 (!) 149/79    The patient's blood pressure is elevated above target today.  In order to address the patient's elevated BP: Blood pressure will be monitored at home to determine if medication changes need to be made.             Physical Exam:    VS:  BP (!) 149/79   Pulse 64   Ht 5\' 9"  (1.753 m)   Wt 189 lb 12.8 oz (86.1 kg)   SpO2 96%   BMI 28.03 kg/m     Wt Readings from Last 3 Encounters:  04/12/23 189 lb 12.8 oz (86.1 kg)  03/22/23 190 lb 2 oz (86.2 kg)  02/05/23 188 lb (85.3 kg)     GEN:  Well nourished, well developed in no acute distress HEENT: Normal NECK: No JVD; No carotid bruits LYMPHATICS: No lymphadenopathy CARDIAC: RRR, no murmurs, rubs, gallops RESPIRATORY:  Clear to auscultation without rales, wheezing or rhonchi  ABDOMEN: Soft, non-tender, non-distended MUSCULOSKELETAL:  No edema; No deformity  SKIN: Warm and dry NEUROLOGIC:  Alert and oriented x 3 PSYCHIATRIC:  Normal affect   ASSESSMENT:    1. Coronary artery disease of native artery of native heart with stable angina pectoris (HCC)   2. Controlled type 2 diabetes mellitus with other specified complication, without long-term current use of insulin (HCC)   3. Atherosclerosis of aorta (HCC)   4. Essential hypertension   5. Preoperative cardiovascular examination    PLAN:    In order of problems listed above:  CAD: He has heavy atherosclerotic calcification but no significant coronary stenoses at this time.  The focus is on lipid-lowering and general risk factor control.  No evidence of heart failure at the time of cardiac catheterization.  His lipid profile was not too bad, but in view of the extent of atherosclerosis I think he should continue statin therapy. DM: hemoglobin A1c 6.6%. Aortic atherosclerosis: With normal caliber aorta. HTN: Appears to have a component of situational hypertension.  Asked him to make sure that his home blood pressure monitor has been checked against an office monitor.  No changes made to his medications today. Preop CV eval: Low risk for major coronary or other cardiac complications with planned cholecystectomy.       Medication Adjustments/Labs and Tests Ordered: Current medicines are  reviewed at length with the patient today.  Concerns regarding medicines are outlined above.  Orders Placed This Encounter  Procedures  EKG 12-Lead   No orders of the defined types were placed in this encounter.   Patient Instructions  Medication Instructions:  No changes *If you need a refill on your cardiac medications before your next appointment, please call your pharmacy*  Follow-Up: At [facility], you and your health needs are our priority.  As part of our continuing mission to provide you with exceptional heart care, we have created designated Provider Care Teams.  These Care Teams include your primary Cardiologist (physician) and Advanced Practice Providers (APPs -  Physician Assistants and Nurse Practitioners) who all work together to provide you with the care you need, when you need it.  We recommend signing up for the patient portal called "MyChart".  Sign up information is provided on this After Visit Summary.  MyChart is used to connect with patients for Virtual Visits (Telemedicine).  Patients are able to view lab/test results, encounter notes, upcoming appointments, etc.  Non-urgent messages can be sent to your provider as well.   To learn more about what you can do with MyChart, go to [email or web address].    Your next appointment:   1 year(s)  Provider:   Dr Royann Shivers          Signed, Thurmon Fair, MD  04/13/2023 4:56 PM    [facility]

## 2023-04-12 NOTE — Patient Instructions (Signed)

## 2023-04-13 DIAGNOSIS — I10 Essential (primary) hypertension: Secondary | ICD-10-CM | POA: Insufficient documentation

## 2023-04-13 DIAGNOSIS — I700 Atherosclerosis of aorta: Secondary | ICD-10-CM | POA: Insufficient documentation

## 2023-04-13 DIAGNOSIS — E119 Type 2 diabetes mellitus without complications: Secondary | ICD-10-CM | POA: Insufficient documentation

## 2023-04-13 DIAGNOSIS — I2510 Atherosclerotic heart disease of native coronary artery without angina pectoris: Secondary | ICD-10-CM | POA: Insufficient documentation

## 2023-05-07 ENCOUNTER — Other Ambulatory Visit: Payer: Self-pay

## 2023-05-07 ENCOUNTER — Encounter (HOSPITAL_COMMUNITY)
Admission: RE | Admit: 2023-05-07 | Discharge: 2023-05-07 | Disposition: A | Discharge status: Home / Self Care | Payer: PPO | Source: Ambulatory Visit | Attending: General Surgery

## 2023-05-07 ENCOUNTER — Encounter (HOSPITAL_COMMUNITY): Payer: Self-pay

## 2023-05-07 VITALS — BP 149/85 | HR 59 | Temp 97.9°F | Resp 18 | Ht 69.0 in | Wt 199.9 lb

## 2023-05-07 DIAGNOSIS — E119 Type 2 diabetes mellitus without complications: Secondary | ICD-10-CM | POA: Diagnosis not present

## 2023-05-07 DIAGNOSIS — K8020 Calculus of gallbladder without cholecystitis without obstruction: Secondary | ICD-10-CM | POA: Insufficient documentation

## 2023-05-07 DIAGNOSIS — I2510 Atherosclerotic heart disease of native coronary artery without angina pectoris: Secondary | ICD-10-CM | POA: Insufficient documentation

## 2023-05-07 DIAGNOSIS — Z87891 Personal history of nicotine dependence: Secondary | ICD-10-CM | POA: Diagnosis not present

## 2023-05-07 DIAGNOSIS — K219 Gastro-esophageal reflux disease without esophagitis: Secondary | ICD-10-CM | POA: Insufficient documentation

## 2023-05-07 DIAGNOSIS — Z8619 Personal history of other infectious and parasitic diseases: Secondary | ICD-10-CM | POA: Insufficient documentation

## 2023-05-07 DIAGNOSIS — Z01812 Encounter for preprocedural laboratory examination: Secondary | ICD-10-CM | POA: Diagnosis not present

## 2023-05-07 DIAGNOSIS — I10 Essential (primary) hypertension: Secondary | ICD-10-CM | POA: Insufficient documentation

## 2023-05-07 DIAGNOSIS — Z01818 Encounter for other preprocedural examination: Secondary | ICD-10-CM

## 2023-05-07 HISTORY — DX: Dyspnea, unspecified: R06.00

## 2023-05-07 HISTORY — DX: Personal history of other diseases of the digestive system: Z87.19

## 2023-05-07 HISTORY — DX: Personal history of urinary calculi: Z87.442

## 2023-05-07 HISTORY — DX: Type 2 diabetes mellitus without complications: E11.9

## 2023-05-07 HISTORY — DX: Unspecified osteoarthritis, unspecified site: M19.90

## 2023-05-07 LAB — CBC
HCT: 46.5 % (ref 39.0–52.0)
Hemoglobin: 15.3 g/dL (ref 13.0–17.0)
MCH: 28.9 pg (ref 26.0–34.0)
MCHC: 32.9 g/dL (ref 30.0–36.0)
MCV: 87.7 fL (ref 80.0–100.0)
Platelets: 266 10*3/uL (ref 150–400)
RBC: 5.3 MIL/uL (ref 4.22–5.81)
RDW: 13.2 % (ref 11.5–15.5)
WBC: 9.2 10*3/uL (ref 4.0–10.5)
nRBC: 0 % (ref 0.0–0.2)

## 2023-05-07 LAB — COMPREHENSIVE METABOLIC PANEL
ALT: 22 U/L (ref 0–44)
AST: 21 U/L (ref 15–41)
Albumin: 4.1 g/dL (ref 3.5–5.0)
Alkaline Phosphatase: 64 U/L (ref 38–126)
Anion gap: 7 (ref 5–15)
BUN: 15 mg/dL (ref 8–23)
CO2: 28 mmol/L (ref 22–32)
Calcium: 9.9 mg/dL (ref 8.9–10.3)
Chloride: 102 mmol/L (ref 98–111)
Creatinine, Ser: 1.19 mg/dL (ref 0.61–1.24)
GFR, Estimated: >60 mL/min (ref >60)
Glucose, Bld: 112 mg/dL — ABNORMAL HIGH (ref 70–99)
Potassium: 5.2 mmol/L — ABNORMAL HIGH (ref 3.5–5.1)
Sodium: 137 mmol/L (ref 135–145)
Total Bilirubin: 0.8 mg/dL (ref 0.0–1.2)
Total Protein: 7.3 g/dL (ref 6.5–8.1)

## 2023-05-07 LAB — GLUCOSE, CAPILLARY: Glucose-Capillary: 130 mg/dL — ABNORMAL HIGH (ref 70–99)

## 2023-05-07 NOTE — Progress Notes (Signed)
PCP - Dr. Lynnea Ferrier Cardiologist - Dr. Rachelle Hora Croitoru - clearance note in on 04/12/23  PPM/ICD - denies Device Orders - n/a Rep Notified - n/a  Chest x-ray - denies EKG - 04/12/23 Stress Test - greater than 5 years ago ECHO - 09/08/19 Cardiac Cath - 02/05/23  Sleep Study - n/a CPAP - n/a  Fasting Blood Sugar - 130s-140s Checks Blood Sugar once every 2-3 days  Last dose of GLP1 agonist-  n/a GLP1 instructions: n/a  Blood Thinner Instructions: n/a Aspirin Instructions:  Per cardiology clearance, "if necessary, patient can hold Aspirin 5-7 days".  Patient was not given any instructions from Dr. Doreen Salvage office on when to stop his Aspirin.  During PAT appointment, this nurse reached out to Dr. Doreen Salvage office and spoke with Toniann Fail.  Toniann Fail stated that she would confirm with Dr. Dwain Sarna and will call the patient.     ERAS Protcol - clears until 0430 PRE-SURGERY Ensure or G2-  G2 as ordered  COVID TEST- n/a   Anesthesia review: yes - CAD, cardiology clearance  Patient denies shortness of breath, fever, cough and chest pain at PAT appointment   All instructions explained to the patient, with a verbal understanding of the material. Patient agrees to go over the instructions while at home for a better understanding. Patient also instructed to self quarantine after being tested for COVID-19. The opportunity to ask questions was provided.

## 2023-05-08 NOTE — Progress Notes (Signed)
Anesthesia Chart Review:  Case: [id number] Date/Time: 05/13/23 0715   Procedure: LAPAROSCOPIC CHOLECYSTECTOMY - GENERAL & TAP BLOCK   Anesthesia type: General   Pre-op diagnosis: Gallstones   [facility]: [facility] ROOM 07 / [facility]   Surgeons: [name], MD       DISCUSSION: Patient is a 78 year old male scheduled for the above procedure.  History includes former smoker (quit 08/20/22), post-operative N/V, HTN, DM2, GERD, hiatal hernia, DOE, hepatitis C, spinal surgery.  He had mild nonobstructive CAD by LHC on 02/05/23.  He had preoperative cardiology evaluation on 04/12/23 by Dr. Royann Shivers. Patient with strong family history of CAD with known heavy atherosclerotic calcifications but only mild nonobstructive CAD by LHC in 01/2023. In 2021 he also underwent echocardiogram showing LVEF 50-55%, mild LVH, evidence of aortic valve sclerosis, mild AR/MR/TR. He has been managed on statin therapy. He denied CV symptoms. "Preop CV eval: Low risk for major coronary or other cardiac complications with planned cholecystectomy."  Anesthesia team to evaluate on the day of surgery.   VS: BP (!) 149/85   Pulse (!) 59   Temp 36.6 C   Resp 18   Ht 5\' 9"  (1.753 m)   Wt 90.7 kg   SpO2 100%   BMI 29.52 kg/m    PROVIDERS: Donita Brooks, MD is PCP  Croitoru, Rachelle Hora, MD is cardiologist   LABS: Labs reviewed: Acceptable for surgery. A1c 6.6% on 03/22/23.  (all labs ordered are listed, but only abnormal results are displayed)  Labs Reviewed  GLUCOSE, CAPILLARY - Abnormal; Notable for the following components:      Result Value   Glucose-Capillary 130 (*)    All other components within normal limits  COMPREHENSIVE METABOLIC PANEL - Abnormal; Notable for the following components:   Potassium 5.2 (*)    Glucose, Bld 112 (*)    All other components within normal limits  CBC     IMAGES: CT Chest (over read CT CAC) 01/24/23: IMPRESSION: No acute extracardiac incidental findings identified.    [place] Abd 12/24/22: IMPRESSION: 1. Nonobstructing left renal stone.  No hydronephrosis. 2. Simple and septated left renal cyst. No further follow-up imaging is recommended. 3. Heterogeneous and mildly increased hepatic parenchymal echogenicity likely representing mild steatosis.   EKG: 04/12/23:  Sinus rhythm with occasional Premature ventricular complexes Septal infarct (cited on or before 29-Jan-2023) When compared with ECG of 29-Jan-2023 12:59, Premature ventricular complexes are now Present Confirmed by Croitoru, Mihai [phone]) on 04/12/2023 4:42:38 PM   CV: Cardiac cath 02/05/23:   Dist RCA lesion is 20% stenosed.   Mid LAD lesion is 30% stenosed.   Ost LAD to Prox LAD lesion is 30% stenosed.   1st Mrg lesion is 20% stenosed.   Mild non-obstructive CAD The LAD has mild plaque at the ostium and a mild mid vessel stenosis There is mild plaque in a moderate caliber obtuse marginal branch Large dominant RCA with mild distal stenosis.    Recommendations: Medical management of mild CAD    CT Cardiac Calcium Scoring 01/24/23: IMPRESSION: 1. Coronary calcium score of 1439. This was 82nd percentile for age-, race-, and sex-matched controls. 2.  Aortic atherosclerosis. 3. Recommend aggressive risk factor modification including LDL goal at least <70. 4.  Consider cardiology referral.   Echocardiogram 09/08/19:  Normal LV systolic function with visual EF 50-55%. Left ventricle cavity  is normal in size. Normal global wall motion. Mild left ventricular  hypertrophy. Indeterminate diastolic filling pattern.  Left atrial cavity is moderately dilated.  Aortic sclerosis without stenosis. Mild (Grade I) aortic regurgitation.  Mild (Grade I) mitral regurgitation.  Mild tricuspid regurgitation. No evidence of pulmonary hypertension.  Compared to previous study dated 02/12/2019: LVEF improved to 50-55% and  LA now moderately dilated.    Past Medical History:  Diagnosis Date    Arthritis    Diabetes mellitus without complication (HCC)    Dyspnea    with exertion   GERD (gastroesophageal reflux disease)    Hep C w/o coma, chronic (HCC)    History of hiatal hernia    History of kidney stones    Hypertension    PONV (postoperative nausea and vomiting)    Prediabetes     Past Surgical History:  Procedure Laterality Date   BACK SURGERY  1980   BALLOON DILATION N/A 05/19/2021   Procedure: BALLOON DILATION;  Surgeon: Lanelle Bal, DO;  [facility];  Service: Endoscopy;  Laterality: N/A;   COLONOSCOPY WITH PROPOFOL N/A 05/19/2021   Procedure: COLONOSCOPY WITH PROPOFOL;  Surgeon: Lanelle Bal, DO;  [facility];  Service: Endoscopy;  Laterality: N/A;  8:00am   ESOPHAGOGASTRODUODENOSCOPY (EGD) WITH PROPOFOL N/A 05/19/2021   Procedure: ESOPHAGOGASTRODUODENOSCOPY (EGD) WITH PROPOFOL;  Surgeon: Lanelle Bal, DO;  [facility];  Service: Endoscopy;  Laterality: N/A;   LEFT HEART CATH AND CORONARY ANGIOGRAPHY N/A 02/05/2023   Procedure: LEFT HEART CATH AND CORONARY ANGIOGRAPHY;  Surgeon: Kathleene Hazel, MD;  [facility];  Service: Cardiovascular;  Laterality: N/A;    MEDICATIONS:  aspirin EC 81 MG tablet   hydrochlorothiazide (HYDRODIURIL) 12.5 MG tablet   losartan (COZAAR) 100 MG tablet   metFORMIN (GLUCOPHAGE) 500 MG tablet   metoprolol succinate (TOPROL-XL) 50 MG 24 hr tablet   pantoprazole (PROTONIX) 40 MG tablet   rosuvastatin (CRESTOR) 10 MG tablet   No current facility-administered medications for this encounter.    Shonna Chock, PA-C Surgical Short Stay/Anesthesiology [facility] Phone [phone] [facility] Phone [phone] 05/08/2023 3:07 PM

## 2023-05-08 NOTE — Anesthesia Preprocedure Evaluation (Addendum)
Anesthesia Evaluation  Patient identified by MRN, date of birth, ID band Patient awake    Reviewed: Allergy & Precautions, NPO status , Patient's Chart, lab work & pertinent test results, reviewed documented beta blocker date and time   History of Anesthesia Complications (+) PONV and history of anesthetic complications  Airway Mallampati: II  TM Distance: >3 FB Neck ROM: Full    Dental  (+) Dental Advisory Given, Missing,    Pulmonary former smoker   Pulmonary exam normal breath sounds clear to auscultation       Cardiovascular Exercise Tolerance: Good hypertension, Pt. on medications and Pt. on home beta blockers + CAD  Normal cardiovascular exam+ dysrhythmias (frequent PVCS)  Rhythm:Regular Rate:Normal     Neuro/Psych negative neurological ROS  negative psych ROS   GI/Hepatic ,GERD  Medicated,,(+) Hepatitis -, C  Endo/Other  negative endocrine ROSdiabetes, Type 2    Renal/GU negative Renal ROS  negative genitourinary   Musculoskeletal  (+) Arthritis ,    Abdominal   Peds negative pediatric ROS (+)  Hematology negative hematology ROS (+)   Anesthesia Other Findings   Reproductive/Obstetrics negative OB ROS                             Anesthesia Physical Anesthesia Plan  ASA: 3  Anesthesia Plan: General   Post-op Pain Management: Regional block* and Tylenol PO (pre-op)*   Induction: Intravenous  PONV Risk Score and Plan: 3 and Ondansetron, Dexamethasone, Midazolam and Treatment may vary due to age or medical condition  Airway Management Planned: Oral ETT  Additional Equipment:   Intra-op Plan:   Post-operative Plan:   Informed Consent: I have reviewed the patients History and Physical, chart, labs and discussed the procedure including the risks, benefits and alternatives for the proposed anesthesia with the patient or authorized representative who has indicated his/her  understanding and acceptance.     Dental advisory given  Plan Discussed with: CRNA and Surgeon  Anesthesia Plan Comments: (PAT note written 05/08/2023 by Shonna Chock, PA-C.  )        Anesthesia Quick Evaluation

## 2023-05-13 ENCOUNTER — Ambulatory Visit (HOSPITAL_COMMUNITY): Payer: PPO | Admitting: Vascular Surgery

## 2023-05-13 ENCOUNTER — Encounter (HOSPITAL_COMMUNITY): Payer: Self-pay | Admitting: General Surgery

## 2023-05-13 ENCOUNTER — Other Ambulatory Visit: Payer: Self-pay

## 2023-05-13 ENCOUNTER — Ambulatory Visit (HOSPITAL_COMMUNITY)
Admission: RE | Admit: 2023-05-13 | Discharge: 2023-05-13 | Disposition: A | Discharge status: Home / Self Care | Payer: PPO | Attending: General Surgery | Admitting: General Surgery

## 2023-05-13 ENCOUNTER — Ambulatory Visit (HOSPITAL_BASED_OUTPATIENT_CLINIC_OR_DEPARTMENT_OTHER): Payer: PPO | Admitting: Anesthesiology

## 2023-05-13 ENCOUNTER — Encounter (HOSPITAL_COMMUNITY)
Admission: RE | Disposition: A | Discharge status: Home / Self Care | Payer: Self-pay | Source: Home / Self Care | Attending: General Surgery

## 2023-05-13 DIAGNOSIS — G8918 Other acute postprocedural pain: Secondary | ICD-10-CM | POA: Diagnosis not present

## 2023-05-13 DIAGNOSIS — K8010 Calculus of gallbladder with chronic cholecystitis without obstruction: Secondary | ICD-10-CM | POA: Diagnosis not present

## 2023-05-13 DIAGNOSIS — Z87891 Personal history of nicotine dependence: Secondary | ICD-10-CM

## 2023-05-13 DIAGNOSIS — K8044 Calculus of bile duct with chronic cholecystitis without obstruction: Secondary | ICD-10-CM

## 2023-05-13 DIAGNOSIS — K8064 Calculus of gallbladder and bile duct with chronic cholecystitis without obstruction: Secondary | ICD-10-CM | POA: Insufficient documentation

## 2023-05-13 DIAGNOSIS — Z01818 Encounter for other preprocedural examination: Secondary | ICD-10-CM

## 2023-05-13 DIAGNOSIS — E119 Type 2 diabetes mellitus without complications: Secondary | ICD-10-CM | POA: Diagnosis not present

## 2023-05-13 DIAGNOSIS — I10 Essential (primary) hypertension: Secondary | ICD-10-CM | POA: Diagnosis not present

## 2023-05-13 DIAGNOSIS — K811 Chronic cholecystitis: Secondary | ICD-10-CM | POA: Diagnosis not present

## 2023-05-13 DIAGNOSIS — I2510 Atherosclerotic heart disease of native coronary artery without angina pectoris: Secondary | ICD-10-CM | POA: Diagnosis not present

## 2023-05-13 DIAGNOSIS — K219 Gastro-esophageal reflux disease without esophagitis: Secondary | ICD-10-CM | POA: Diagnosis not present

## 2023-05-13 HISTORY — PX: CHOLECYSTECTOMY: SHX55

## 2023-05-13 LAB — GLUCOSE, CAPILLARY
Glucose-Capillary: 144 mg/dL — ABNORMAL HIGH (ref 70–99)
Glucose-Capillary: 177 mg/dL — ABNORMAL HIGH (ref 70–99)

## 2023-05-13 SURGERY — LAPAROSCOPIC CHOLECYSTECTOMY
Anesthesia: General | Site: Abdomen

## 2023-05-13 MED ORDER — ROCURONIUM BROMIDE 10 MG/ML (PF) SYRINGE
PREFILLED_SYRINGE | INTRAVENOUS | Status: DC | PRN
  Administered 2023-05-13: 10 mg via INTRAVENOUS
  Administered 2023-05-13: 30 mg via INTRAVENOUS

## 2023-05-13 MED ORDER — SODIUM CHLORIDE 0.9 % IV SOLN: 12.5000 mg | INTRAVENOUS | Status: DC | PRN

## 2023-05-13 MED ORDER — CEFAZOLIN SODIUM-DEXTROSE 2-4 GM/100ML-% IV SOLN
2.0000 g | INTRAVENOUS | Status: AC
Start: 2023-05-13 — End: 2023-05-13
  Administered 2023-05-13: 2 g via INTRAVENOUS

## 2023-05-13 MED ORDER — MIDAZOLAM HCL 2 MG/2ML IJ SOLN
INTRAMUSCULAR | Status: AC
  Filled 2023-05-13: qty 2

## 2023-05-13 MED ORDER — CHLORHEXIDINE GLUCONATE 0.12 % MT SOLN
15.0000 mL | Freq: Once | OROMUCOSAL | Status: AC
  Administered 2023-05-13: 15 mL via OROMUCOSAL

## 2023-05-13 MED ORDER — BUPIVACAINE-EPINEPHRINE (PF) 0.25% -1:200000 IJ SOLN
INTRAMUSCULAR | Status: AC
  Filled 2023-05-13: qty 30

## 2023-05-13 MED ORDER — HYDROMORPHONE HCL 1 MG/ML IJ SOLN
INTRAMUSCULAR | Status: AC
  Filled 2023-05-13: qty 1

## 2023-05-13 MED ORDER — ACETAMINOPHEN 500 MG PO TABS: 1000.0000 mg | ORAL_TABLET | ORAL | Status: AC

## 2023-05-13 MED ORDER — ORAL CARE MOUTH RINSE: 15.0000 mL | Freq: Once | OROMUCOSAL | Status: AC

## 2023-05-13 MED ORDER — ACETAMINOPHEN 500 MG PO TABS
ORAL_TABLET | ORAL | Status: AC
  Administered 2023-05-13: 1000 mg via ORAL
  Filled 2023-05-13: qty 2

## 2023-05-13 MED ORDER — ACETAMINOPHEN 325 MG PO TABS: 650.0000 mg | ORAL_TABLET | ORAL | Status: DC | PRN

## 2023-05-13 MED ORDER — OXYCODONE HCL 5 MG PO TABS: 5.0000 mg | ORAL_TABLET | Freq: Four times a day (QID) | ORAL | 0 refills | Status: DC | PRN

## 2023-05-13 MED ORDER — ONDANSETRON HCL 4 MG/2ML IJ SOLN
INTRAMUSCULAR | Status: DC | PRN
  Administered 2023-05-13: 4 mg via INTRAVENOUS

## 2023-05-13 MED ORDER — BUPIVACAINE-EPINEPHRINE 0.25% -1:200000 IJ SOLN
INTRAMUSCULAR | Status: DC | PRN
  Administered 2023-05-13: 9 mL

## 2023-05-13 MED ORDER — MIDAZOLAM HCL 2 MG/2ML IJ SOLN
INTRAMUSCULAR | Status: DC | PRN
  Administered 2023-05-13: 1 mg via INTRAVENOUS

## 2023-05-13 MED ORDER — OXYCODONE HCL 5 MG/5ML PO SOLN: 5.0000 mg | Freq: Once | ORAL | Status: AC | PRN

## 2023-05-13 MED ORDER — PROPOFOL 10 MG/ML IV BOLUS
INTRAVENOUS | Status: DC | PRN
  Administered 2023-05-13: 130 mg via INTRAVENOUS

## 2023-05-13 MED ORDER — ENSURE PRE-SURGERY PO LIQD
296.0000 mL | Freq: Once | ORAL | Status: DC
Start: 2023-05-13 — End: 2023-05-13

## 2023-05-13 MED ORDER — OXYCODONE HCL 5 MG PO TABS: 5.0000 mg | ORAL_TABLET | ORAL | Status: DC | PRN

## 2023-05-13 MED ORDER — SUGAMMADEX SODIUM 200 MG/2ML IV SOLN
INTRAVENOUS | Status: DC | PRN
  Administered 2023-05-13: 200 mg via INTRAVENOUS

## 2023-05-13 MED ORDER — AMISULPRIDE (ANTIEMETIC) 5 MG/2ML IV SOLN: 10.0000 mg | Freq: Once | INTRAVENOUS | Status: DC | PRN

## 2023-05-13 MED ORDER — LACTATED RINGERS IV SOLN: INTRAVENOUS | Status: DC

## 2023-05-13 MED ORDER — HYDROMORPHONE HCL 1 MG/ML IJ SOLN
0.2500 mg | INTRAMUSCULAR | Status: DC | PRN
  Administered 2023-05-13 (×4): 0.5 mg via INTRAVENOUS

## 2023-05-13 MED ORDER — ROCURONIUM BROMIDE 10 MG/ML (PF) SYRINGE
PREFILLED_SYRINGE | INTRAVENOUS | Status: AC
  Filled 2023-05-13: qty 10

## 2023-05-13 MED ORDER — ACETAMINOPHEN 650 MG RE SUPP: 650.0000 mg | RECTAL | Status: DC | PRN

## 2023-05-13 MED ORDER — SODIUM CHLORIDE 0.9 % IV SOLN: 250.0000 mL | INTRAVENOUS | Status: DC | PRN

## 2023-05-13 MED ORDER — PHENYLEPHRINE 80 MCG/ML (10ML) SYRINGE FOR IV PUSH (FOR BLOOD PRESSURE SUPPORT)
PREFILLED_SYRINGE | INTRAVENOUS | Status: AC
  Filled 2023-05-13: qty 10

## 2023-05-13 MED ORDER — DEXMEDETOMIDINE HCL IN NACL 80 MCG/20ML IV SOLN
INTRAVENOUS | Status: DC | PRN
  Administered 2023-05-13: 8 ug via INTRAVENOUS

## 2023-05-13 MED ORDER — ROPIVACAINE HCL 5 MG/ML IJ SOLN
INTRAMUSCULAR | Status: DC | PRN
  Administered 2023-05-13: 50 mL via PERINEURAL

## 2023-05-13 MED ORDER — OXYCODONE HCL 5 MG PO TABS
ORAL_TABLET | ORAL | Status: AC
  Filled 2023-05-13: qty 1

## 2023-05-13 MED ORDER — SODIUM CHLORIDE 0.9% FLUSH: 3.0000 mL | INTRAVENOUS | Status: DC | PRN

## 2023-05-13 MED ORDER — CHLORHEXIDINE GLUCONATE CLOTH 2 % EX PADS: 6.0000 | MEDICATED_PAD | Freq: Once | CUTANEOUS | Status: DC

## 2023-05-13 MED ORDER — LIDOCAINE 2% (20 MG/ML) 5 ML SYRINGE
INTRAMUSCULAR | Status: DC | PRN
  Administered 2023-05-13: 80 mg via INTRAVENOUS

## 2023-05-13 MED ORDER — SUCCINYLCHOLINE CHLORIDE 200 MG/10ML IV SOSY
PREFILLED_SYRINGE | INTRAVENOUS | Status: DC | PRN
  Administered 2023-05-13: 120 mg via INTRAVENOUS

## 2023-05-13 MED ORDER — ONDANSETRON HCL 4 MG/2ML IJ SOLN
INTRAMUSCULAR | Status: AC
  Filled 2023-05-13: qty 2

## 2023-05-13 MED ORDER — LIDOCAINE 2% (20 MG/ML) 5 ML SYRINGE
INTRAMUSCULAR | Status: AC
  Filled 2023-05-13: qty 5

## 2023-05-13 MED ORDER — DEXAMETHASONE SODIUM PHOSPHATE 10 MG/ML IJ SOLN
INTRAMUSCULAR | Status: DC | PRN
  Administered 2023-05-13: 10 mg via INTRAVENOUS

## 2023-05-13 MED ORDER — DEXAMETHASONE SODIUM PHOSPHATE 10 MG/ML IJ SOLN
INTRAMUSCULAR | Status: AC
  Filled 2023-05-13: qty 1

## 2023-05-13 MED ORDER — SPY AGENT GREEN - (INDOCYANINE FOR INJECTION)
1.2500 mg | Freq: Once | INTRAMUSCULAR | Status: AC
  Administered 2023-05-13: 1.25 mg via INTRAVENOUS
  Filled 2023-05-13: qty 10

## 2023-05-13 MED ORDER — OXYCODONE HCL 5 MG PO TABS
5.0000 mg | ORAL_TABLET | Freq: Once | ORAL | Status: AC | PRN
  Administered 2023-05-13: 5 mg via ORAL

## 2023-05-13 MED ORDER — CEFAZOLIN SODIUM-DEXTROSE 2-4 GM/100ML-% IV SOLN
INTRAVENOUS | Status: AC
  Filled 2023-05-13: qty 100

## 2023-05-13 MED ORDER — PHENYLEPHRINE 80 MCG/ML (10ML) SYRINGE FOR IV PUSH (FOR BLOOD PRESSURE SUPPORT)
PREFILLED_SYRINGE | INTRAVENOUS | Status: DC | PRN
  Administered 2023-05-13: 80 ug via INTRAVENOUS

## 2023-05-13 MED ORDER — FENTANYL CITRATE (PF) 250 MCG/5ML IJ SOLN
INTRAMUSCULAR | Status: AC
  Filled 2023-05-13: qty 5

## 2023-05-13 MED ORDER — SODIUM CHLORIDE 0.9 % IR SOLN
Status: DC | PRN
  Administered 2023-05-13: 1000 mL

## 2023-05-13 MED ORDER — FENTANYL CITRATE (PF) 250 MCG/5ML IJ SOLN
INTRAMUSCULAR | Status: DC | PRN
Start: 2023-05-13 — End: 2023-05-13
  Administered 2023-05-13 (×5): 50 ug via INTRAVENOUS

## 2023-05-13 MED ORDER — INSULIN ASPART 100 UNIT/ML IJ SOLN: 0.0000 [IU] | INTRAMUSCULAR | Status: DC | PRN

## 2023-05-13 MED ORDER — SUGAMMADEX SODIUM 200 MG/2ML IV SOLN
INTRAVENOUS | Status: AC
  Filled 2023-05-13: qty 2

## 2023-05-13 MED ORDER — PROPOFOL 10 MG/ML IV BOLUS
INTRAVENOUS | Status: AC
  Filled 2023-05-13: qty 20

## 2023-05-13 MED ORDER — 0.9 % SODIUM CHLORIDE (POUR BTL) OPTIME
TOPICAL | Status: DC | PRN
  Administered 2023-05-13: 1000 mL

## 2023-05-13 SURGICAL SUPPLY — 36 items
APPLIER CLIP 5 13 M/L LIGAMAX5 (MISCELLANEOUS) ×1
BAG COUNTER SPONGE SURGICOUNT (BAG) ×2 IMPLANT
BLADE CLIPPER SURG (BLADE) IMPLANT
CANISTER SUCT 3000ML PPV (MISCELLANEOUS) ×2 IMPLANT
CHLORAPREP W/TINT 26 (MISCELLANEOUS) ×2 IMPLANT
CLIP APPLIE 5 13 M/L LIGAMAX5 (MISCELLANEOUS) ×2 IMPLANT
COVER SURGICAL LIGHT HANDLE (MISCELLANEOUS) ×2 IMPLANT
DERMABOND ADVANCED .7 DNX12 (GAUZE/BANDAGES/DRESSINGS) ×2 IMPLANT
ELECT REM PT RETURN 9FT ADLT (ELECTROSURGICAL) ×1
ELECTRODE REM PT RTRN 9FT ADLT (ELECTROSURGICAL) ×2 IMPLANT
GLOVE BIO SURGEON STRL SZ7 (GLOVE) ×2 IMPLANT
GLOVE BIOGEL PI IND STRL 7.5 (GLOVE) ×2 IMPLANT
GOWN STRL REUS W/ TWL LRG LVL3 (GOWN DISPOSABLE) ×6 IMPLANT
GRASPER SUT TROCAR 14GX15 (MISCELLANEOUS) ×2 IMPLANT
IRRIG SUCT STRYKERFLOW 2 WTIP (MISCELLANEOUS) ×1
IRRIGATION SUCT STRKRFLW 2 WTP (MISCELLANEOUS) ×2 IMPLANT
KIT BASIN OR (CUSTOM PROCEDURE TRAY) ×2 IMPLANT
KIT IMAGING PINPOINTPAQ (MISCELLANEOUS) IMPLANT
KIT TURNOVER KIT B (KITS) ×2 IMPLANT
NS IRRIG 1000ML POUR BTL (IV SOLUTION) ×2 IMPLANT
PAD ARMBOARD 7.5X6 YLW CONV (MISCELLANEOUS) ×2 IMPLANT
POUCH RETRIEVAL ECOSAC 10 (ENDOMECHANICALS) ×2 IMPLANT
SCISSORS LAP 5X35 DISP (ENDOMECHANICALS) ×2 IMPLANT
SET TUBE SMOKE EVAC HIGH FLOW (TUBING) ×2 IMPLANT
SLEEVE Z-THREAD 5X100MM (TROCAR) ×4 IMPLANT
SPECIMEN JAR SMALL (MISCELLANEOUS) ×2 IMPLANT
STRIP CLOSURE SKIN 1/2X4 (GAUZE/BANDAGES/DRESSINGS) ×2 IMPLANT
SUT MNCRL AB 4-0 PS2 18 (SUTURE) ×2 IMPLANT
SUT VICRYL 0 UR6 27IN ABS (SUTURE) ×2 IMPLANT
TOWEL GREEN STERILE (TOWEL DISPOSABLE) ×2 IMPLANT
TOWEL GREEN STERILE FF (TOWEL DISPOSABLE) ×2 IMPLANT
TRAY LAPAROSCOPIC MC (CUSTOM PROCEDURE TRAY) ×2 IMPLANT
TROCAR BALLN 12MMX100 BLUNT (TROCAR) ×2 IMPLANT
TROCAR Z-THREAD OPTICAL 5X100M (TROCAR) ×2 IMPLANT
WARMER LAPAROSCOPE (MISCELLANEOUS) ×2 IMPLANT
WATER STERILE IRR 1000ML POUR (IV SOLUTION) ×2 IMPLANT

## 2023-05-13 NOTE — Transfer of Care (Signed)
Immediate Anesthesia Transfer of Care Note  Patient: James Harper  Procedure(s) Performed: LAPAROSCOPIC CHOLECYSTECTOMY (Abdomen)  Patient Location: PACU  Anesthesia Type:GA combined with regional for post-op pain  Level of Consciousness: awake, alert , oriented, and patient cooperative  Airway & Oxygen Therapy: Patient Spontanous Breathing and Patient connected to nasal cannula oxygen  Post-op Assessment: Report given to RN and Post -op Vital signs reviewed and stable  Post vital signs: Reviewed and stable  Last Vitals:  Vitals Value Taken Time  BP 172/77 05/13/23 0845  Temp    Pulse 68 05/13/23 0847  Resp 13 05/13/23 0847  SpO2 98 % 05/13/23 0847  Vitals shown include unfiled device data.  Last Pain:  Vitals:   05/13/23 0613  TempSrc:   PainSc: 0-No pain         Complications: No notable events documented.

## 2023-05-13 NOTE — Anesthesia Procedure Notes (Addendum)
Procedure Name: Intubation Date/Time: 05/13/2023 7:45 AM  Performed by: Ammie Dalton, CRNAPre-anesthesia Checklist: Patient identified, Emergency Drugs available, Suction available and Patient being monitored Patient Re-evaluated:Patient Re-evaluated prior to induction Oxygen Delivery Method: Circle System Utilized Preoxygenation: Pre-oxygenation with 100% oxygen Induction Type: IV induction Ventilation: Mask ventilation without difficulty and Oral airway inserted - appropriate to patient size Laryngoscope Size: Mac and 4 Grade View: Grade III Tube type: Oral Number of attempts: 1 Airway Equipment and Method: Stylet and Oral airway Placement Confirmation: ETT inserted through vocal cords under direct vision, positive ETCO2 and breath sounds checked- equal and bilateral Secured at: 22 cm Tube secured with: Tape Dental Injury: Injury to lip  Comments: Pinched lower left lip- small blister. Lubricant applied.  All dentition intact.

## 2023-05-13 NOTE — H&P (Signed)
  James Harper, a 78 yo retired individual with a history of back surgery in the 1980s, kidney stones, and current management of cholesterol, blood pressure, and diabetes, presents with abdominal pain that has been ongoing since September. The pain is often triggered by physical activity such as yard work, forcing the patient to stop due to its severity. . The patient denies any nausea or vomiting. The patient is a smoker. The patient's primary care physician suspects gallbladder issues. He has a ruq u/s that shows sludge in his gb.   Review of Systems  HENT: Positive for hearing loss.  Gastrointestinal: Positive for abdominal pain and diarrhea.     Outpatient Medications Prior to Visit  Medication Sig Dispense Refill  aspirin 81 MG EC tablet Take 81 mg by mouth once daily  hydroCHLOROthiazide (HYDRODIURIL) 12.5 MG tablet TAKE ONE TABLET (12.5MG  TOTAL) BY MOUTH DAILY  losartan (COZAAR) 100 MG tablet TAKE ONE TABLET (100MG  TOTAL) BY MOUTH DAILY  metFORMIN (GLUCOPHAGE) 500 MG tablet TAKE ONE TABLET (500MG  TOTAL) BY MOUTH TWO TIMES DAILY WITH A MEAL  metoprolol SUCCinate (TOPROL-XL) 50 MG XL tablet TAKE ONE TABLET (50MG  TOTAL) BY MOUTH DAILY. TAKE WITH OR IMMEDIATELY FOLLOWING A MEAL.  pantoprazole (PROTONIX) 40 MG DR tablet Take 1 tablet by mouth 2 (two) times daily  rosuvastatin (CRESTOR) 10 MG tablet Take 1 tablet by mouth once daily   PSH back surgery  Allergies omnipaque, norvasc    Objective   Vitals:  04/04/23 1538 04/04/23 1539  BP: 138/78  Pulse: 69  Temp: 36.7 C (98 F)  SpO2: 97%  Weight: 87 kg (191 lb 12.8 oz)  Height: 175.3 cm (5\' 9" )  PainSc: 0-No pain  PainLoc: Abdomen   Body mass index is 28.32 kg/m.  Home Vitals:   Physical Exam Constitutional:  Appearance: Normal appearance.  Cardiovascular:  Rate and Rhythm: Normal rate.  Pulmonary:  Effort: Pulmonary effort is normal.  Abdominal:  Palpations: Abdomen is soft.  Tenderness: There is abdominal  tenderness in the right upper quadrant and epigastric area.  Neurological:  Mental Status: He is alert.   Assessment/Plan:   Lap chole   Epigastric pain since September, worsened by physical activity. Ultrasound revealed gallbladder sludge. Pain radiates to the back, consistent with gallbladder disease. Discussed the risks/benefits of gallbladder removal surgery (cholecystectomy) including the risk of infection, bleeding, and the rare possibility of leaving a small piece of the gallbladder if it is inflamed if adherent to cbd (subtotal). I discussed the procedure in detail. We discussed the risks and benefits of a laparoscopic cholecystectomy and possible cholangiogram including, but not limited to bleeding, infection, injury to surrounding structures such as the intestine or liver, bile leak, retained gallstones, need to convert to an open procedure, prolonged diarrhea, blood clots such as DVT, common bile duct injury, anesthesia risks, and possible need for additional procedures. The likelihood of improvement in symptoms and return to the patient's normal status is good. We discussed the typical post-operative recovery course.

## 2023-05-13 NOTE — Discharge Instructions (Signed)

## 2023-05-13 NOTE — Op Note (Signed)
  Preoperative diagnosis: biliary colic Postoperative diagnosis: Same as above, chronic cholecystitis Procedure: Laparoscopic cholecystectomy Surgeon: Dr. Harden Mo Anesthesia: General with TAP blocks Estimated blood loss: Minimal Complications: None Drains: None Specimens: Gallbladder and contents to pathology Sponge needle count was correct at completion Disposition recovery in stable condition   Indications: 31 yom with ruq pain for some time.  He has tenderness on exam. US shows sludge. We discussed proceeding with lap chole.   Procedure: After informed consent was obtained he was taken to the operating room.  She was given antibiotics.  SCDs were in place.  He was given ICG dye.  He was placed under general anesthesia without complication.  He was prepped and draped in a standard sterile surgical fashion.  A surgical timeout was then performed.   I infiltrated Marcaine below his umbilicus.  I made an infraumbilical incision.  I grasped the fascia and incised this sharply.    I placed a 0 Vicryl pursestring suture through the fascia and inserted a Hassan trocar.  I insufflated the abdomen to 15 mmHg pressure.  I then inserted 3 additional 5 mm trocars in the epigastrium and right upper quadrant without difficulty.  The gallbladder had evidence of chronic inflammation. I had to bluntly dissect the omentum off the gallbladder as it was adherent. The gallbladder was then retracted cephalad and lateral.  I was able to dissect in the triangle and clearly obtained a wide critical view of safety.  I did use the ICG dye and this confirmed that I was well away from the bile duct.   I then clipped the artery and divided it leaving 2 clips in place.  I clipped the cystic duct three times and divided it leaving two in place. These two clips traversed the duct and the duct was viable.  There was a posterior arterial branch that I clipped as well.   This was placed in a retrieval bag and removed.  I  obtained hemostasis.   I then removed the [place] trocar and tied the pursestring down.  I placed an two additional 2-0 Vicryl sutures through the fascia and completely obliterated the defect.  The remaining trocars were then removed and the abdomen desufflated.  These were closed with 4-0 Monocryl and glue.  He tolerated it well was extubated and transferred recovery stable.

## 2023-05-13 NOTE — Anesthesia Procedure Notes (Signed)
Anesthesia Regional Block: TAP block   Pre-Anesthetic Checklist: , timeout performed,  Correct Patient, Correct Site, Correct Laterality,  Correct Procedure, Correct Position, site marked,  Risks and benefits discussed,  Surgical consent,  Pre-op evaluation,  At surgeon's request and post-op pain management  Laterality: Left and Right  Prep: chloraprep       Needles:  Injection technique: Single-shot  Needle Type: Stimiplex     Needle Length: 9cm  Needle Gauge: 21     Additional Needles:   Procedures:,,,, ultrasound used (permanent image in chart),,    Narrative:  Start time: 05/13/2023 7:22 AM End time: 05/13/2023 7:27 AM Injection made incrementally with aspirations every 5 mL.  Performed by: Personally  Anesthesiologist: Lowella Curb, MD

## 2023-05-13 NOTE — Anesthesia Postprocedure Evaluation (Signed)
Anesthesia Post Note  Patient: James Harper  Procedure(s) Performed: LAPAROSCOPIC CHOLECYSTECTOMY (Abdomen)     Patient location during evaluation: PACU Anesthesia Type: General Level of consciousness: awake and alert Pain management: pain level controlled Vital Signs Assessment: post-procedure vital signs reviewed and stable Respiratory status: spontaneous breathing, nonlabored ventilation and respiratory function stable Cardiovascular status: blood pressure returned to baseline and stable Postop Assessment: no apparent nausea or vomiting Anesthetic complications: no   No notable events documented.  Last Vitals:  Vitals:   05/13/23 0915 05/13/23 0930  BP: (!) 165/75 (!) 147/75  Pulse: 77 65  Resp: 14 10  Temp:  36.6 C  SpO2: 95% 96%    Last Pain:  Vitals:   05/13/23 0930  TempSrc:   PainSc: 4                  Lowella Curb

## 2023-05-13 NOTE — Interval H&P Note (Signed)
History and Physical Interval Note:  05/13/2023 7:12 AM  James Harper  has presented today for surgery, with the diagnosis of Gallstones.  The various methods of treatment have been discussed with the patient and family. After consideration of risks, benefits and other options for treatment, the patient has consented to  Procedure(s) with comments: LAPAROSCOPIC CHOLECYSTECTOMY (N/A) - GENERAL & TAP BLOCK as a surgical intervention.  The patient's history has been reviewed, patient examined, no change in status, stable for surgery.  I have reviewed the patient's chart and labs.  Questions were answered to the patient's satisfaction.     [name]

## 2023-05-14 ENCOUNTER — Encounter (HOSPITAL_COMMUNITY): Payer: Self-pay | Admitting: General Surgery

## 2023-05-14 LAB — SURGICAL PATHOLOGY

## 2023-05-21 ENCOUNTER — Ambulatory Visit: Payer: PPO | Admitting: Cardiovascular Disease

## 2023-05-23 ENCOUNTER — Other Ambulatory Visit: Payer: Self-pay | Admitting: Family Medicine

## 2023-06-21 ENCOUNTER — Other Ambulatory Visit: Payer: Self-pay | Admitting: Family Medicine

## 2023-06-21 DIAGNOSIS — R002 Palpitations: Secondary | ICD-10-CM

## 2023-06-21 DIAGNOSIS — I493 Ventricular premature depolarization: Secondary | ICD-10-CM

## 2023-06-24 NOTE — Telephone Encounter (Signed)
 Requested Prescriptions  Pending Prescriptions Disp Refills   metoprolol succinate (TOPROL-XL) 50 MG 24 hr tablet [Pharmacy Med Name: METOPROLOL SUCCINATE ER 50 MG TAB] 90 tablet 1    Sig: TAKE ONE TABLET (50MG  TOTAL) BY MOUTH DAILY. TAKE WITH OR IMMEDIATELY FOLLOWING A MEAL.     Cardiovascular:  Beta Blockers Failed - 06/24/2023 11:48 AM      Failed - Last BP in normal range    BP Readings from Last 1 Encounters:  05/13/23 (!) 147/75         Failed - Valid encounter within last 6 months    Recent Outpatient Visits           3 months ago RUQ pain   [facility] Tanya Nones, Priscille Heidelberg, MD   6 months ago Epigastric pain   [place] [facility] Tanya Nones, Priscille Heidelberg, MD   1 year ago Viral upper respiratory tract infection   [facility] Park Meo, FNP              Passed - Last Heart Rate in normal range    Pulse Readings from Last 1 Encounters:  05/13/23 65

## 2023-10-02 DIAGNOSIS — M9902 Segmental and somatic dysfunction of thoracic region: Secondary | ICD-10-CM | POA: Diagnosis not present

## 2023-10-02 DIAGNOSIS — M9901 Segmental and somatic dysfunction of cervical region: Secondary | ICD-10-CM | POA: Diagnosis not present

## 2023-10-02 DIAGNOSIS — M546 Pain in thoracic spine: Secondary | ICD-10-CM | POA: Diagnosis not present

## 2023-10-02 DIAGNOSIS — M542 Cervicalgia: Secondary | ICD-10-CM | POA: Diagnosis not present

## 2023-10-03 ENCOUNTER — Ambulatory Visit

## 2023-10-07 DIAGNOSIS — M542 Cervicalgia: Secondary | ICD-10-CM | POA: Diagnosis not present

## 2023-10-07 DIAGNOSIS — M9901 Segmental and somatic dysfunction of cervical region: Secondary | ICD-10-CM | POA: Diagnosis not present

## 2023-10-07 DIAGNOSIS — M546 Pain in thoracic spine: Secondary | ICD-10-CM | POA: Diagnosis not present

## 2023-10-07 DIAGNOSIS — M9902 Segmental and somatic dysfunction of thoracic region: Secondary | ICD-10-CM | POA: Diagnosis not present

## 2023-10-11 DIAGNOSIS — M9901 Segmental and somatic dysfunction of cervical region: Secondary | ICD-10-CM | POA: Diagnosis not present

## 2023-10-11 DIAGNOSIS — M9902 Segmental and somatic dysfunction of thoracic region: Secondary | ICD-10-CM | POA: Diagnosis not present

## 2023-10-11 DIAGNOSIS — M542 Cervicalgia: Secondary | ICD-10-CM | POA: Diagnosis not present

## 2023-10-11 DIAGNOSIS — M546 Pain in thoracic spine: Secondary | ICD-10-CM | POA: Diagnosis not present

## 2023-10-14 DIAGNOSIS — M9901 Segmental and somatic dysfunction of cervical region: Secondary | ICD-10-CM | POA: Diagnosis not present

## 2023-10-14 DIAGNOSIS — M9902 Segmental and somatic dysfunction of thoracic region: Secondary | ICD-10-CM | POA: Diagnosis not present

## 2023-10-14 DIAGNOSIS — M546 Pain in thoracic spine: Secondary | ICD-10-CM | POA: Diagnosis not present

## 2023-10-14 DIAGNOSIS — M542 Cervicalgia: Secondary | ICD-10-CM | POA: Diagnosis not present

## 2023-10-18 DIAGNOSIS — M9902 Segmental and somatic dysfunction of thoracic region: Secondary | ICD-10-CM | POA: Diagnosis not present

## 2023-10-18 DIAGNOSIS — M542 Cervicalgia: Secondary | ICD-10-CM | POA: Diagnosis not present

## 2023-10-18 DIAGNOSIS — M9901 Segmental and somatic dysfunction of cervical region: Secondary | ICD-10-CM | POA: Diagnosis not present

## 2023-10-18 DIAGNOSIS — M546 Pain in thoracic spine: Secondary | ICD-10-CM | POA: Diagnosis not present

## 2023-10-23 DIAGNOSIS — M9901 Segmental and somatic dysfunction of cervical region: Secondary | ICD-10-CM | POA: Diagnosis not present

## 2023-10-23 DIAGNOSIS — M542 Cervicalgia: Secondary | ICD-10-CM | POA: Diagnosis not present

## 2023-10-23 DIAGNOSIS — M9902 Segmental and somatic dysfunction of thoracic region: Secondary | ICD-10-CM | POA: Diagnosis not present

## 2023-10-23 DIAGNOSIS — M546 Pain in thoracic spine: Secondary | ICD-10-CM | POA: Diagnosis not present

## 2023-12-12 ENCOUNTER — Other Ambulatory Visit: Payer: Self-pay | Admitting: Family Medicine

## 2023-12-13 NOTE — Telephone Encounter (Signed)
 Courtesy refill given, appointment needed.   Requested Prescriptions  Pending Prescriptions Disp Refills   metFORMIN  (GLUCOPHAGE ) 500 MG tablet [Pharmacy Med Name: METFORMIN  HCL 500 MG TAB] 60 tablet 0    Sig: TAKE ONE TABLET (500MG  TOTAL) BY MOUTH TWO TIMES DAILY WITH A MEAL     Endocrinology:  Diabetes - Biguanides Failed - 12/13/2023 11:40 AM      Failed - HBA1C is between 0 and 7.9 and within 180 days    Hgb A1c MFr Bld  Date Value Ref Range Status  03/22/2023 6.6 (H) <5.7 % of total Hgb Final    Comment:    For someone without known diabetes, a hemoglobin A1c value of 6.5% or greater indicates that they may have  diabetes and this should be confirmed with a follow-up  test. . For someone with known diabetes, a value <7% indicates  that their diabetes is well controlled and a value  greater than or equal to 7% indicates suboptimal  control. A1c targets should be individualized based on  duration of diabetes, age, comorbid conditions, and  other considerations. . Currently, no consensus exists regarding use of hemoglobin A1c for diagnosis of diabetes for children. .          Failed - B12 Level in normal range and within 720 days    No results found for: VITAMINB12       Failed - Valid encounter within last 6 months    Recent Outpatient Visits           8 months ago RUQ pain   [facility] Pickard, Butler DASEN, MD   11 months ago Epigastric pain   [facility] Duanne, Butler DASEN, MD   1 year ago Viral upper respiratory tract infection    [facility] Kayla Jeoffrey RAMAN, FNP              Passed - Cr in normal range and within 360 days    Creat  Date Value Ref Range Status  03/22/2023 1.17 0.70 - 1.28 mg/dL Final   Creatinine, Ser  Date Value Ref Range Status  05/07/2023 1.19 0.61 - 1.24 mg/dL Final         Passed - eGFR in normal range and within 360 days    GFR, Est African  American  Date Value Ref Range Status  09/08/2020 85 > OR = 60 mL/min/1.10m2 Final   GFR, Est Non African American  Date Value Ref Range Status  09/08/2020 73 > OR = 60 mL/min/1.61m2 Final   GFR, Estimated  Date Value Ref Range Status  05/07/2023 >60 >60 mL/min Final    Comment:    (NOTE) Calculated using the CKD-EPI Creatinine Equation (2021)    eGFR  Date Value Ref Range Status  03/22/2023 64 > OR = 60 mL/min/1.81m2 Final  01/29/2023 66 >59 mL/min/1.73 Final         Passed - CBC within normal limits and completed in the last 12 months    WBC  Date Value Ref Range Status  05/07/2023 9.2 4.0 - 10.5 K/uL Final   RBC  Date Value Ref Range Status  05/07/2023 5.30 4.22 - 5.81 MIL/uL Final   Hemoglobin  Date Value Ref Range Status  05/07/2023 15.3 13.0 - 17.0 g/dL Final  88/94/7975 84.3 13.0 - 17.7 g/dL Final   HCT  Date Value Ref Range Status  05/07/2023 46.5 39.0 - 52.0 % Final  Hematocrit  Date Value Ref Range Status  01/29/2023 47.9 37.5 - 51.0 % Final   MCHC  Date Value Ref Range Status  05/07/2023 32.9 30.0 - 36.0 g/dL Final   [facility]  Date Value Ref Range Status  05/07/2023 28.9 26.0 - 34.0 pg Final   MCV  Date Value Ref Range Status  05/07/2023 87.7 80.0 - 100.0 fL Final  01/29/2023 89 79 - 97 fL Final   No results found for: PLTCOUNTKUC, LABPLAT, POCPLA RDW  Date Value Ref Range Status  05/07/2023 13.2 11.5 - 15.5 % Final  01/29/2023 12.5 11.6 - 15.4 % Final

## 2023-12-19 ENCOUNTER — Encounter: Payer: Self-pay | Admitting: Family Medicine

## 2023-12-19 ENCOUNTER — Ambulatory Visit (HOSPITAL_COMMUNITY)
Admission: RE | Admit: 2023-12-19 | Discharge: 2023-12-19 | Disposition: A | Discharge status: Home / Self Care | Source: Ambulatory Visit | Attending: Family Medicine | Admitting: Family Medicine

## 2023-12-19 ENCOUNTER — Ambulatory Visit (INDEPENDENT_AMBULATORY_CARE_PROVIDER_SITE_OTHER): Admitting: Family Medicine

## 2023-12-19 VITALS — BP 124/62 | HR 69 | Temp 98.8°F | Ht 69.0 in | Wt 189.6 lb

## 2023-12-19 DIAGNOSIS — E118 Type 2 diabetes mellitus with unspecified complications: Secondary | ICD-10-CM

## 2023-12-19 DIAGNOSIS — M542 Cervicalgia: Secondary | ICD-10-CM | POA: Diagnosis not present

## 2023-12-19 DIAGNOSIS — M47812 Spondylosis without myelopathy or radiculopathy, cervical region: Secondary | ICD-10-CM | POA: Diagnosis not present

## 2023-12-19 DIAGNOSIS — R519 Headache, unspecified: Secondary | ICD-10-CM

## 2023-12-19 DIAGNOSIS — I493 Ventricular premature depolarization: Secondary | ICD-10-CM

## 2023-12-19 DIAGNOSIS — R200 Anesthesia of skin: Secondary | ICD-10-CM | POA: Diagnosis not present

## 2023-12-19 DIAGNOSIS — R002 Palpitations: Secondary | ICD-10-CM | POA: Diagnosis not present

## 2023-12-19 DIAGNOSIS — M4802 Spinal stenosis, cervical region: Secondary | ICD-10-CM | POA: Diagnosis not present

## 2023-12-19 MED ORDER — METFORMIN HCL 500 MG PO TABS: 500.0000 mg | ORAL_TABLET | Freq: Two times a day (BID) | ORAL | 3 refills | Status: AC

## 2023-12-19 MED ORDER — AMOXICILLIN-POT CLAVULANATE 875-125 MG PO TABS: 1.0000 | ORAL_TABLET | Freq: Two times a day (BID) | ORAL | 0 refills | Status: AC

## 2023-12-19 MED ORDER — METOPROLOL SUCCINATE ER 50 MG PO TB24: 50.0000 mg | ORAL_TABLET | Freq: Every day | ORAL | 3 refills | Status: AC

## 2023-12-19 NOTE — Progress Notes (Signed)
 Wt Readings from Last 3 Encounters:  12/19/23 189 lb 9.6 oz (86 kg)  05/13/23 190 lb (86.2 kg)  05/07/23 199 lb 14.4 oz (90.7 kg)     Subjective:    Patient ID: James Harper, male    DOB: 05/23/1945, 78 y.o.   MRN: [id number] Patient states he got sinus infection.  He reports pain and pressure in both maxillary sinuses left greater than right and both frontal sinuses.  However he also reports blurry vision at times in his left sinus.  He reports temporal headaches on both sides.  He reports pain and stiffness in his neck and shoulders.  The headaches have been going on for several months although they have recently worsened.  Patient's vision today is normal.  He has no tenderness to palpation over the temporal artery.  Pupils are equal round reactive to light.  There is no conjunctival injection.  He has no pain or stiffness in the cervical paraspinal muscles or shoulders. Past Medical History:  Diagnosis Date   Arthritis    Diabetes mellitus without complication (HCC)    Dyspnea    with exertion   GERD (gastroesophageal reflux disease)    Hep C w/o coma, chronic (HCC)    History of hiatal hernia    History of kidney stones    Hypertension    PONV (postoperative nausea and vomiting)    Prediabetes    Past Surgical History:  Procedure Laterality Date   BACK SURGERY  1980   BALLOON DILATION N/A 05/19/2021   Procedure: BALLOON DILATION;  Surgeon: Cindie Carlin POUR, DO;  Location: [facility];  Service: Endoscopy;  Laterality: N/A;   CHOLECYSTECTOMY N/A 05/13/2023   Procedure: LAPAROSCOPIC CHOLECYSTECTOMY;  Surgeon: Ebbie Cough, MD;  Location: [facility];  Service: General;  Laterality: N/A;  GENERAL & TAP BLOCK   COLONOSCOPY WITH PROPOFOL  N/A 05/19/2021   Procedure: COLONOSCOPY WITH PROPOFOL ;  Surgeon: Cindie Carlin POUR, DO;  Location: [facility];  Service: Endoscopy;  Laterality: N/A;  8:00am   ESOPHAGOGASTRODUODENOSCOPY (EGD) WITH PROPOFOL  N/A 05/19/2021   Procedure:  ESOPHAGOGASTRODUODENOSCOPY (EGD) WITH PROPOFOL ;  Surgeon: Cindie Carlin POUR, DO;  Location: [facility];  Service: Endoscopy;  Laterality: N/A;   LEFT HEART CATH AND CORONARY ANGIOGRAPHY N/A 02/05/2023   Procedure: LEFT HEART CATH AND CORONARY ANGIOGRAPHY;  Surgeon: Verlin Lonni BIRCH, MD;  Location: [facility];  Service: Cardiovascular;  Laterality: N/A;   Current Outpatient Medications on File Prior to Visit  Medication Sig Dispense Refill   aspirin  EC 81 MG tablet Take 81 mg by mouth daily.     hydrochlorothiazide  (HYDRODIURIL ) 12.5 MG tablet TAKE ONE TABLET (12.5MG  TOTAL) BY MOUTH DAILY 90 tablet 3   losartan  (COZAAR ) 100 MG tablet TAKE ONE TABLET (100MG  TOTAL) BY MOUTH DAILY 90 tablet 3   pantoprazole  (PROTONIX ) 40 MG tablet Take 1 tablet (40 mg total) by mouth 2 (two) times daily. (Patient taking differently: Take 40 mg by mouth 2 (two) times daily as needed (acid reflux).) 60 tablet 3   No current facility-administered medications on file prior to visit.   Allergies  Allergen Reactions   Omnipaque  [Iohexol ] Nausea And Vomiting    Pt states he has severe vomiting with IV contrast, especially the older kind he had over 20 yrs ago.     Norvasc [Amlodipine Besylate] Swelling   Social History   Socioeconomic History   Marital status: Married    Spouse name: Not on file   Number of children:  2   Years of education: Not on file   Highest education level: Not on file  Occupational History   Not on file  Tobacco Use   Smoking status: Former    Current packs/day: 0.00    Average packs/day: 0.3 packs/day for 15.0 years (3.8 ttl pk-yrs)    Types: Cigarettes    Start date: 08/19/1957    Quit date: 08/19/1972    Years since quitting: 51.3   Smokeless tobacco: Never  Vaping Use   Vaping status: Never Used  Substance and Sexual Activity   Alcohol use: Yes    Comment: rare beer   Drug use: Never   Sexual activity: Not on file  Other Topics Concern   Not on file   Social History Narrative   Not on file   Social Drivers of Health   Financial Resource Strain: Low Risk  (05/15/2022)   Overall Financial Resource Strain (CARDIA)    Difficulty of Paying Living Expenses: Not hard at all  Food Insecurity: No Food Insecurity (05/15/2022)   Hunger Vital Sign    Worried About Running Out of Food in the Last Year: Never true    Ran Out of Food in the Last Year: Never true  Transportation Needs: No Transportation Needs (05/15/2022)   PRAPARE - Administrator, Civil Service (Medical): No    Lack of Transportation (Non-Medical): No  Physical Activity: Sufficiently Active (05/15/2022)   Exercise Vital Sign    Days of Exercise per Week: 5 days    Minutes of Exercise per Session: 30 min  Stress: No Stress Concern Present (05/15/2022)   [employer] of Occupational Health - Occupational Stress Questionnaire    Feeling of Stress : Not at all  Social Connections: Socially Integrated (05/15/2022)   Social Connection and Isolation Panel    Frequency of Communication with Friends and Family: More than three times a week    Frequency of Social Gatherings with Friends and Family: Three times a week    Attends Religious Services: More than 4 times per year    Active Member of Clubs or Organizations: Yes    Attends Banker Meetings: More than 4 times per year    Marital Status: Married  Catering manager Violence: Not At Risk (05/15/2022)   Humiliation, Afraid, Rape, and Kick questionnaire    Fear of Current or Ex-Partner: No    Emotionally Abused: No    Physically Abused: No    Sexually Abused: No    Review of Systems  Gastrointestinal:  Positive for abdominal pain.       Objective:   Physical Exam Vitals reviewed.  Constitutional:      General: He is not in acute distress.    Appearance: Normal appearance. He is not ill-appearing, toxic-appearing or diaphoretic.  HENT:     Head: Normocephalic and atraumatic.     Right Ear:  Tympanic membrane and ear canal normal.     Left Ear: Tympanic membrane and ear canal normal.     Nose: No congestion or rhinorrhea.     Right Sinus: No maxillary sinus tenderness or frontal sinus tenderness.     Left Sinus: Maxillary sinus tenderness present. No frontal sinus tenderness.  Eyes:     Extraocular Movements: Extraocular movements intact.     Pupils: Pupils are equal, round, and reactive to light.  Cardiovascular:     Rate and Rhythm: Normal rate and regular rhythm.     Pulses: Normal pulses.  Heart sounds: Normal heart sounds. No murmur heard.    No friction rub. No gallop.  Pulmonary:     Effort: Pulmonary effort is normal. No respiratory distress.     Breath sounds: Normal breath sounds. No stridor. No wheezing, rhonchi or rales.  Abdominal:     General: Abdomen is flat. Bowel sounds are normal. There is no distension.     Palpations: Abdomen is soft. There is no mass.     Tenderness: There is no abdominal tenderness. There is no guarding or rebound.     Hernia: No hernia is present.  Musculoskeletal:     Cervical back: No rigidity.     Right lower leg: No edema.     Left lower leg: No edema.  Lymphadenopathy:     Cervical: No cervical adenopathy.  Skin:    Findings: No rash.  Neurological:     General: No focal deficit present.     Mental Status: He is alert and oriented to person, place, and time. Mental status is at baseline.     Cranial Nerves: No cranial nerve deficit.     Sensory: No sensory deficit.     Motor: No weakness.     Coordination: Coordination normal.     Gait: Gait normal.     Deep Tendon Reflexes: Reflexes normal.           Assessment & Plan:  Controlled diabetes mellitus type 2 with complications, unspecified whether long term insulin  use - Plan: CBC with Differential/Platelet, Comprehensive metabolic panel with GFR, Hemoglobin A1c, Lipid panel  Acute intractable headache, unspecified headache type - Plan: Sedimentation  rate  Frequent PVCs - Plan: metoprolol  succinate (TOPROL -XL) 50 MG 24 hr tablet  Palpitations - Plan: metoprolol  succinate (TOPROL -XL) 50 MG 24 hr tablet  Neck pain - Plan: DG Cervical Spine Complete I am not convinced that this is a sinus infection.  Here is really not congested.  He also states that he has been getting headaches almost 2 months.  Given some of the blurry vision and the location of the headaches and the pain and stiffness in his neck I am concerned about temporal arteritis.  I will check a sed rate.  If significantly elevated, consider starting the patient on prednisone and proceeding with a temporal artery biopsy.  Start the patient empirically on Augmentin  for possible sinus infection.  Hopefully the sed rate will be normal and the pain will improve with the Augmentin .  Also obtain an x-ray of the neck to evaluate if there is significant cervical degenerative disc disease that perhaps could be causing tension headaches along with neck stiffness which would be a third possibility.  While the patient is here today I will also check his fasting lab work

## 2023-12-20 ENCOUNTER — Ambulatory Visit: Payer: Self-pay | Admitting: Family Medicine

## 2023-12-20 LAB — LIPID PANEL
Cholesterol: 147 mg/dL (ref <200)
HDL: 65 mg/dL (ref >=40)
LDL Cholesterol (Calc): 66 mg/dL
Non-HDL Cholesterol (Calc): 82 mg/dL (ref <130)
Total CHOL/HDL Ratio: 2.3 (calc) (ref <5.0)
Triglycerides: 76 mg/dL (ref <150)

## 2023-12-20 LAB — COMPREHENSIVE METABOLIC PANEL WITH GFR
AG Ratio: 1.7 (calc) (ref 1.0–2.5)
ALT: 19 U/L (ref 9–46)
AST: 17 U/L (ref 10–35)
Albumin: 4.5 g/dL (ref 3.6–5.1)
Alkaline phosphatase (APISO): 66 U/L (ref 35–144)
BUN: 20 mg/dL (ref 7–25)
CO2: 30 mmol/L (ref 20–32)
Calcium: 9.9 mg/dL (ref 8.6–10.3)
Chloride: 102 mmol/L (ref 98–110)
Creat: 1.13 mg/dL (ref 0.70–1.28)
Globulin: 2.7 g/dL (ref 1.9–3.7)
Glucose, Bld: 114 mg/dL — ABNORMAL HIGH (ref 65–99)
Potassium: 4.8 mmol/L (ref 3.5–5.3)
Sodium: 139 mmol/L (ref 135–146)
Total Bilirubin: 0.9 mg/dL (ref 0.2–1.2)
Total Protein: 7.2 g/dL (ref 6.1–8.1)
eGFR: 67 mL/min/1.73m2 (ref >=60)

## 2023-12-20 LAB — HEMOGLOBIN A1C
Hgb A1c MFr Bld: 6.2 % — ABNORMAL HIGH (ref <5.7)
Mean Plasma Glucose: 131 mg/dL
eAG (mmol/L): 7.3 mmol/L

## 2023-12-20 LAB — CBC WITH DIFFERENTIAL/PLATELET
Absolute Lymphocytes: 1693 {cells}/uL (ref 850–3900)
Absolute Monocytes: 614 {cells}/uL (ref 200–950)
Basophils Absolute: 56 {cells}/uL (ref 0–200)
Basophils Relative: 0.6 %
Eosinophils Absolute: 177 {cells}/uL (ref 15–500)
Eosinophils Relative: 1.9 %
HCT: 45.5 % (ref 38.5–50.0)
Hemoglobin: 15 g/dL (ref 13.2–17.1)
MCH: 29.4 pg (ref 27.0–33.0)
MCHC: 33 g/dL (ref 32.0–36.0)
MCV: 89 fL (ref 80.0–100.0)
MPV: 11.1 fL (ref 7.5–12.5)
Monocytes Relative: 6.6 %
Neutro Abs: 6761 {cells}/uL (ref 1500–7800)
Neutrophils Relative %: 72.7 %
Platelets: 253 Thousand/uL (ref 140–400)
RBC: 5.11 Million/uL (ref 4.20–5.80)
RDW: 12.9 % (ref 11.0–15.0)
Total Lymphocyte: 18.2 %
WBC: 9.3 Thousand/uL (ref 3.8–10.8)

## 2023-12-20 LAB — SEDIMENTATION RATE: Sed Rate: 2 mm/h (ref 0–20)

## 2023-12-24 ENCOUNTER — Telehealth: Payer: Self-pay

## 2023-12-24 ENCOUNTER — Other Ambulatory Visit: Payer: Self-pay | Admitting: Family Medicine

## 2023-12-24 MED ORDER — PREDNISONE 20 MG PO TABS: ORAL_TABLET | ORAL | 0 refills | Status: AC

## 2023-12-24 NOTE — Telephone Encounter (Signed)
 Copied from CRM #[id number]. Topic: Clinical - Lab/Test Results >> Dec 24, 2023 12:49 PM Delon DASEN wrote: Reason for CRM: daughter calling, patient is still waiting for xray results- please call patient   [phone]

## 2024-03-11 ENCOUNTER — Other Ambulatory Visit: Payer: Self-pay | Admitting: Family Medicine

## 2024-03-11 DIAGNOSIS — I10 Essential (primary) hypertension: Secondary | ICD-10-CM

## 2024-03-16 ENCOUNTER — Other Ambulatory Visit: Payer: Self-pay | Admitting: Family Medicine

## 2024-03-16 DIAGNOSIS — I10 Essential (primary) hypertension: Secondary | ICD-10-CM

## 2024-04-01 ENCOUNTER — Encounter: Payer: Self-pay | Admitting: Cardiovascular Disease

## 2024-04-07 NOTE — Progress Notes (Signed)
 James Harper                                          MRN: [id number]   04/07/2024   The [facility] Quality Team Specialist reviewed this patient medical record for the purposes of chart review for care gap closure. The following were reviewed: chart review for care gap closure-kidney health evaluation for diabetes:eGFR  and uACR.    [facility] Quality Team
